# Patient Record
Sex: Female | Born: 1990 | Race: White | Hispanic: No | Marital: Married | Smoking: Never smoker
Health system: Southern US, Community
[De-identification: ages and names within clinical notes are randomized; demographics above are authoritative.]

## PROBLEM LIST (undated history)

## (undated) DIAGNOSIS — G43909 Migraine, unspecified, not intractable, without status migrainosus: Secondary | ICD-10-CM

## (undated) DIAGNOSIS — K760 Fatty (change of) liver, not elsewhere classified: Secondary | ICD-10-CM

## (undated) DIAGNOSIS — E559 Vitamin D deficiency, unspecified: Secondary | ICD-10-CM

## (undated) DIAGNOSIS — K219 Gastro-esophageal reflux disease without esophagitis: Secondary | ICD-10-CM

## (undated) DIAGNOSIS — M545 Low back pain, unspecified: Secondary | ICD-10-CM

## (undated) DIAGNOSIS — M2550 Pain in unspecified joint: Secondary | ICD-10-CM

## (undated) HISTORY — DX: Migraine, unspecified, not intractable, without status migrainosus: G43.909

## (undated) HISTORY — DX: Low back pain, unspecified: M54.50

## (undated) HISTORY — DX: Pain in unspecified joint: M25.50

## (undated) HISTORY — DX: Low back pain: M54.5

## (undated) HISTORY — DX: Vitamin D deficiency, unspecified: E55.9

---
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
## 2008-02-17 ENCOUNTER — Other Ambulatory Visit: Admission: RE | Admit: 2008-02-17 | Discharge: 2008-02-17 | Payer: Self-pay | Admitting: Family Medicine

## 2008-03-08 ENCOUNTER — Encounter: Admission: RE | Admit: 2008-03-08 | Discharge: 2008-03-08 | Payer: Self-pay | Admitting: Family Medicine

## 2009-10-21 ENCOUNTER — Emergency Department (HOSPITAL_COMMUNITY): Admission: EM | Admit: 2009-10-21 | Discharge: 2009-10-21 | Payer: Self-pay | Admitting: Emergency Medicine

## 2010-07-09 ENCOUNTER — Observation Stay (HOSPITAL_COMMUNITY): Admission: EM | Admit: 2010-07-09 | Discharge: 2010-07-11 | Payer: Self-pay | Admitting: Emergency Medicine

## 2011-01-09 LAB — COMPREHENSIVE METABOLIC PANEL
ALT: 81 U/L — ABNORMAL HIGH (ref 0–35)
ALT: 90 U/L — ABNORMAL HIGH (ref 0–35)
AST: 44 U/L — ABNORMAL HIGH (ref 0–37)
AST: 55 U/L — ABNORMAL HIGH (ref 0–37)
Albumin: 3.7 g/dL (ref 3.5–5.2)
Albumin: 3.9 g/dL (ref 3.5–5.2)
Alkaline Phosphatase: 69 U/L (ref 39–117)
Alkaline Phosphatase: 73 U/L (ref 39–117)
BUN: 11 mg/dL (ref 6–23)
BUN: 6 mg/dL (ref 6–23)
CO2: 26 mEq/L (ref 19–32)
CO2: 25 mEq/L (ref 19–32)
Calcium: 9.4 mg/dL (ref 8.4–10.5)
Calcium: 9.2 mg/dL (ref 8.4–10.5)
Chloride: 108 mEq/L (ref 96–112)
Chloride: 107 mEq/L (ref 96–112)
Creatinine, Ser: 0.94 mg/dL (ref 0.4–1.2)
Creatinine, Ser: 0.87 mg/dL (ref 0.4–1.2)
GFR calc Af Amer: >60 mL/min (ref >60)
GFR calc Af Amer: >60 mL/min (ref >60)
GFR calc non Af Amer: >60 mL/min (ref >60)
GFR calc non Af Amer: >60 mL/min (ref >60)
Glucose, Bld: 96 mg/dL (ref 70–99)
Glucose, Bld: 88 mg/dL (ref 70–99)
Potassium: 3.8 mEq/L (ref 3.5–5.1)
Potassium: 3.8 mEq/L (ref 3.5–5.1)
Sodium: 141 mEq/L (ref 135–145)
Sodium: 139 mEq/L (ref 135–145)
Total Bilirubin: 0.7 mg/dL (ref 0.3–1.2)
Total Bilirubin: 0.9 mg/dL (ref 0.3–1.2)
Total Protein: 6.7 g/dL (ref 6.0–8.3)
Total Protein: 7 g/dL (ref 6.0–8.3)

## 2011-01-09 LAB — DIFFERENTIAL
Basophils Absolute: 0 10*3/uL (ref 0.0–0.1)
Basophils Relative: 0 % (ref 0–1)
Eosinophils Absolute: 0.1 10*3/uL (ref 0.0–0.7)
Eosinophils Relative: 2 % (ref 0–5)
Lymphocytes Relative: 29 % (ref 12–46)
Lymphs Abs: 1.8 10*3/uL (ref 0.7–4.0)
Monocytes Absolute: 0.4 10*3/uL (ref 0.1–1.0)
Monocytes Relative: 6 % (ref 3–12)
Neutro Abs: 3.9 10*3/uL (ref 1.7–7.7)
Neutrophils Relative %: 63 % (ref 43–77)

## 2011-01-09 LAB — LIPID PANEL
Cholesterol: 153 mg/dL (ref 0–200)
LDL Cholesterol: 85 mg/dL (ref 0–99)
Triglycerides: 110 mg/dL (ref <150)

## 2011-01-09 LAB — CBC
HCT: 40.5 % (ref 36.0–46.0)
HCT: 43.7 % (ref 36.0–46.0)
Hemoglobin: 14.1 g/dL (ref 12.0–15.0)
Hemoglobin: 15.5 g/dL — ABNORMAL HIGH (ref 12.0–15.0)
MCH: 32 pg (ref 26.0–34.0)
MCH: 32.8 pg (ref 26.0–34.0)
MCHC: 34.8 g/dL (ref 30.0–36.0)
MCHC: 35.5 g/dL (ref 30.0–36.0)
MCV: 92 fL (ref 78.0–100.0)
MCV: 92.4 fL (ref 78.0–100.0)
Platelets: 230 10*3/uL (ref 150–400)
Platelets: 236 10*3/uL (ref 150–400)
RBC: 4.4 MIL/uL (ref 3.87–5.11)
RBC: 4.73 MIL/uL (ref 3.87–5.11)
RDW: 12.5 % (ref 11.5–15.5)
RDW: 12.6 % (ref 11.5–15.5)
WBC: 6.2 10*3/uL (ref 4.0–10.5)
WBC: 6.4 10*3/uL (ref 4.0–10.5)

## 2011-01-09 LAB — TSH: TSH: 1.871 u[IU]/mL (ref 0.350–4.500)

## 2011-01-09 LAB — TROPONIN I: Troponin I: 0.01 ng/mL (ref 0.00–0.06)

## 2011-01-09 LAB — BASIC METABOLIC PANEL
BUN: 6 mg/dL (ref 6–23)
CO2: 24 mEq/L (ref 19–32)
Calcium: 9.9 mg/dL (ref 8.4–10.5)
Chloride: 108 mEq/L (ref 96–112)
Creatinine, Ser: 0.88 mg/dL (ref 0.4–1.2)
GFR calc Af Amer: >60 mL/min (ref >60)
GFR calc non Af Amer: >60 mL/min (ref >60)
Glucose, Bld: 90 mg/dL (ref 70–99)
Potassium: 4 mEq/L (ref 3.5–5.1)
Sodium: 141 mEq/L (ref 135–145)

## 2011-01-09 LAB — CK TOTAL AND CKMB (NOT AT ARMC)
CK, MB: 1.3 ng/mL (ref 0.3–4.0)
Relative Index: 0.6 (ref 0.0–2.5)
Total CK: 236 U/L — ABNORMAL HIGH (ref 7–177)

## 2011-01-09 LAB — MRSA PCR SCREENING: MRSA by PCR: NEGATIVE

## 2011-01-09 LAB — CARDIAC PANEL(CRET KIN+CKTOT+MB+TROPI)
CK, MB: 0.9 ng/mL (ref 0.3–4.0)
CK, MB: 1 ng/mL (ref 0.3–4.0)
Relative Index: 0.5 (ref 0.0–2.5)
Relative Index: 0.6 (ref 0.0–2.5)
Total CK: 208 U/L — ABNORMAL HIGH (ref 7–177)
Troponin I: 0.01 ng/mL (ref 0.00–0.06)

## 2011-01-09 LAB — MAGNESIUM: Magnesium: 2.1 mg/dL (ref 1.5–2.5)

## 2011-01-09 LAB — POCT PREGNANCY, URINE: Preg Test, Ur: NEGATIVE

## 2011-01-09 LAB — POCT CARDIAC MARKERS
CKMB, poc: 1.1 ng/mL (ref 1.0–8.0)
Myoglobin, poc: 68.3 ng/mL (ref 12–200)
Troponin i, poc: <0.05 ng/mL (ref 0.00–0.09)

## 2011-01-09 LAB — BRAIN NATRIURETIC PEPTIDE: Pro B Natriuretic peptide (BNP): <30 pg/mL (ref 0.0–100.0)

## 2011-01-09 LAB — HEMOGLOBIN A1C
Hgb A1c MFr Bld: 5.4 % (ref <5.7)
Mean Plasma Glucose: 108 mg/dL (ref <117)

## 2011-01-09 LAB — PHOSPHORUS: Phosphorus: 3.6 mg/dL (ref 2.3–4.6)

## 2011-01-09 LAB — D-DIMER, QUANTITATIVE: D-Dimer, Quant: 0.34 ug/mL-FEU (ref 0.00–0.48)

## 2011-01-27 LAB — POCT RAPID STREP A (OFFICE): Streptococcus, Group A Screen (Direct): NEGATIVE

## 2011-06-30 ENCOUNTER — Inpatient Hospital Stay (INDEPENDENT_AMBULATORY_CARE_PROVIDER_SITE_OTHER)
Admission: RE | Admit: 2011-06-30 | Discharge: 2011-06-30 | Disposition: A | Discharge status: Home / Self Care | Payer: 59 | Source: Ambulatory Visit | Attending: Family Medicine | Admitting: Family Medicine

## 2011-06-30 DIAGNOSIS — J069 Acute upper respiratory infection, unspecified: Secondary | ICD-10-CM

## 2013-05-24 ENCOUNTER — Other Ambulatory Visit: Payer: Self-pay | Admitting: Family Medicine

## 2013-05-31 ENCOUNTER — Ambulatory Visit
Admission: RE | Admit: 2013-05-31 | Discharge: 2013-05-31 | Disposition: A | Discharge status: Home / Self Care | Payer: Managed Care, Other (non HMO) | Source: Ambulatory Visit | Attending: Family Medicine | Admitting: Family Medicine

## 2013-11-30 ENCOUNTER — Other Ambulatory Visit (INDEPENDENT_AMBULATORY_CARE_PROVIDER_SITE_OTHER): Payer: Self-pay

## 2013-11-30 ENCOUNTER — Encounter (INDEPENDENT_AMBULATORY_CARE_PROVIDER_SITE_OTHER): Payer: Self-pay | Admitting: General Surgery

## 2013-11-30 ENCOUNTER — Ambulatory Visit (INDEPENDENT_AMBULATORY_CARE_PROVIDER_SITE_OTHER): Payer: Managed Care, Other (non HMO) | Admitting: General Surgery

## 2013-11-30 ENCOUNTER — Encounter (INDEPENDENT_AMBULATORY_CARE_PROVIDER_SITE_OTHER): Payer: Self-pay

## 2013-11-30 NOTE — Progress Notes (Signed)
Subjective:   morbid obesity  Patient ID: Maria Mccoy, female   DOB: 1990-11-13, 23 y.o.   MRN: [id number]  HPI Maria Mccoy is a 23 y.o.female referred by Dr. Zachery DauerBarnes for consideration for surgical treatment for morbid obesity.  She  gives a history of progressive obesity since adolescence despite multiple attempts at medical management. She was overweight as a child an adolescent but was athletic. She however has seen her weight steadily increase over the years despite multiple attempts at nonsurgical management. Her mother was a patient of mine for a gastric bypass about 10 years ago. She initially felt that her mother should have been able to lose weight on her own but as she has gotten older and see how difficult it is to manage her weight she feels that she is going to need help to have any success. 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 She is also very concerned about her health going forward at her current weight..   She has been to our initial information seminar, researched surgical options thoroughly and is open to possibilities were discussed today.  History reviewed. No pertinent past medical history. History reviewed. No pertinent past surgical history. Current Outpatient Prescriptions  Medication Sig Dispense Refill  . rizatriptan (MAXALT) 10 MG tablet Take 10 mg by mouth as needed for migraine. May repeat in 2 hours if needed       No current facility-administered medications for this visit.   Allergies not on file History  Substance Use Topics  . Smoking status: Never Smoker   . Smokeless tobacco: Not on file  . Alcohol Use: No       Review of Systems  Constitutional: Positive for fatigue.  HENT: Negative.   Respiratory: Positive for shortness of breath (Mild, exertional only). Negative for apnea, choking, wheezing and stridor.   Cardiovascular:  Negative.   Gastrointestinal: Positive for abdominal pain (occasional epigastric pain about once a month with negative gallbladder ultrasound in the past). Negative for nausea, vomiting, diarrhea and constipation.       Significant reflux with nighttime heartburn and occasional regurgitation waterbrash for which he takes over-the-counter medications. Has been prescribed medications in the past but she has not taken  Genitourinary: Positive for menstrual problem.  Musculoskeletal: Positive for arthralgias.       Objective:   Physical Exam BP 122/78  Pulse 72  Resp 16  Ht 5\' 8"  (1.727 m)  Wt 324 lb 12.8 oz (147.328 kg)  BMI 49.40 kg/m2 General: Alert, morbidly obese Caucasian female, in no distress Skin: Warm and dry without rash or infection. HEENT: No palpable masses or thyromegaly. Sclera nonicteric. Pupils equal round and reactive. Oropharynx clear. Lymph nodes: No cervical, supraclavicular, or inguinal nodes palpable. Lungs: Breath sounds clear and equal without increased work of breathing Cardiovascular: Regular rate and rhythm without murmur. No JVD or edema. Peripheral pulses intact. Abdomen: Nondistended. Soft and nontender. No masses palpable. No organomegaly. No palpable hernias. Extremities: No edema or joint swelling or deformity. No chronic venous stasis changes. Neurologic: Alert and fully oriented. Gait normal.    Assessment:     23 year old with progressive morbid obesity unresponsive at multiple attempts at nonsurgical management who presents with a BMI of 49. She has comorbidities of significant symptomatic reflux as well as chronic joint pain. We discussed all surgical options available in detail today. She is not interested and  frequent office visits associated with lap band and at her high BMI I think she likely had inadequate weight loss. She was initially interested and sleeve gastrectomy we discussed that there is a chance that this could worsen her reflux and she  feels that this is a significant problem for her currently and would not want to take any chance it would be worsened. We discussed that gastric bypass is actually an effective antireflux operation. After discussion she has elected to proceed with gastric bypass which I believe would be the best choice for her due to her high BMI of significant reflux. We discussed the nature of the procedure and expected recovery period with discussed risks of anesthetic complications, staple line bleeding, leakage, pulmonary emboli as well as long-term risks of nutritional deficiencies, internal hernia, gallstones, ulcer, stricture. She was given a complete consent form to review all her questions were answered. We'll go ahead with preoperative workup including psychologic nutrition evaluation, BreathTek H. Pylori, bone density and routine lab work and x-rays including gallbladder ultrasound.     Plan:     Begin workup for planned laparoscopic Roux-en-Y gastric bypass as above

## 2013-12-30 ENCOUNTER — Ambulatory Visit (HOSPITAL_COMMUNITY)
Admission: RE | Admit: 2013-12-30 | Discharge: 2013-12-30 | Disposition: A | Discharge status: Home / Self Care | Payer: Managed Care, Other (non HMO) | Source: Ambulatory Visit | Attending: General Surgery | Admitting: General Surgery

## 2013-12-30 DIAGNOSIS — K7689 Other specified diseases of liver: Secondary | ICD-10-CM | POA: Insufficient documentation

## 2013-12-30 DIAGNOSIS — Z13820 Encounter for screening for osteoporosis: Secondary | ICD-10-CM | POA: Insufficient documentation

## 2013-12-30 DIAGNOSIS — Z6841 Body Mass Index (BMI) 40.0 and over, adult: Secondary | ICD-10-CM | POA: Insufficient documentation

## 2013-12-30 DIAGNOSIS — G8929 Other chronic pain: Secondary | ICD-10-CM | POA: Insufficient documentation

## 2013-12-30 DIAGNOSIS — M2550 Pain in unspecified joint: Secondary | ICD-10-CM | POA: Insufficient documentation

## 2013-12-30 DIAGNOSIS — R0602 Shortness of breath: Secondary | ICD-10-CM | POA: Insufficient documentation

## 2013-12-30 DIAGNOSIS — K219 Gastro-esophageal reflux disease without esophagitis: Secondary | ICD-10-CM | POA: Insufficient documentation

## 2013-12-31 ENCOUNTER — Ambulatory Visit: Payer: Managed Care, Other (non HMO) | Admitting: Dietician

## 2014-01-04 ENCOUNTER — Ambulatory Visit: Payer: Managed Care, Other (non HMO) | Admitting: Dietician

## 2014-01-05 ENCOUNTER — Encounter: Payer: Self-pay | Admitting: Dietician

## 2014-01-05 ENCOUNTER — Ambulatory Visit (HOSPITAL_COMMUNITY)
Admission: RE | Admit: 2014-01-05 | Discharge: 2014-01-05 | Disposition: A | Discharge status: Home / Self Care | Payer: Managed Care, Other (non HMO) | Source: Ambulatory Visit | Attending: General Surgery | Admitting: General Surgery

## 2014-01-05 ENCOUNTER — Encounter (HOSPITAL_COMMUNITY)
Admission: RE | Disposition: A | Discharge status: Home / Self Care | Payer: Self-pay | Source: Ambulatory Visit | Attending: General Surgery

## 2014-01-05 ENCOUNTER — Encounter: Payer: Managed Care, Other (non HMO) | Attending: General Surgery | Admitting: Dietician

## 2014-01-05 DIAGNOSIS — Z01818 Encounter for other preprocedural examination: Secondary | ICD-10-CM | POA: Insufficient documentation

## 2014-01-05 DIAGNOSIS — Z713 Dietary counseling and surveillance: Secondary | ICD-10-CM | POA: Insufficient documentation

## 2014-01-05 HISTORY — PX: BREATH TEK H PYLORI: SHX5422

## 2014-01-05 SURGERY — BREATH TEST, FOR HELICOBACTER PYLORI

## 2014-01-05 NOTE — Patient Instructions (Signed)
Patient to call the [facility] to enroll in Pre-Op and Post-Op Nutrition Education when surgery date is scheduled. 

## 2014-01-05 NOTE — Progress Notes (Signed)
  Pre-Op Assessment Visit:  Pre-Operative RYGB Surgery  Medical Nutrition Therapy:  Appt start time: 1030   End time:  1115  Patient was seen on 01/05/2014 for Pre-Operative RYGB Nutrition Assessment. Assessment and [facility] of approval [facility] coordinator on 01/05/2014.   Preferred Learning Style:   No preference indicated   Learning Readiness:   Contemplating   Handouts given during visit include:  Pre-Op Goals Bariatric Surgery Protein Shakes   Samples provided:  Premier protein shake (chocolate) Lot# [id number] Exp: 10/2014  Teaching Method Utilized:  Visual Auditory Hands on  Barriers to learning/adherence to lifestyle change: none  Demonstrated degree of understanding via:  Teach Back   Patient to call the [facility] to enroll in Pre-Op and Post-Op Nutrition Education when surgery date is scheduled.

## 2014-01-06 ENCOUNTER — Encounter (HOSPITAL_COMMUNITY): Payer: Self-pay | Admitting: General Surgery

## 2014-01-26 ENCOUNTER — Telehealth (INDEPENDENT_AMBULATORY_CARE_PROVIDER_SITE_OTHER): Payer: Self-pay

## 2014-01-26 NOTE — Telephone Encounter (Signed)
Message copied by Maryan PulsMOORE, Gennaro Lizotte on Thu Jan 26, 2014  3:45 PM ------      Message from: Leanne ChangGALLOWAY, WENDY K      Created: Fri Jan 20, 2014 11:30 AM      Regarding: Dr Johna SheriffHoxworth      Contact: [phone]       Would like ultra sound results from 12/30/13.  ------

## 2014-01-26 NOTE — Telephone Encounter (Signed)
Called and left message for patient regarding her US results (no acute findings)

## 2014-03-28 LAB — LIPID PANEL
CHOL/HDL RATIO: 3.6 ratio
Cholesterol: 142 mg/dL (ref 0–200)
HDL: 40 mg/dL (ref >39)
LDL CALC: 82 mg/dL (ref 0–99)
TRIGLYCERIDES: 102 mg/dL (ref <150)
VLDL: 20 mg/dL (ref 0–40)

## 2014-03-28 LAB — CBC WITH DIFFERENTIAL/PLATELET
BASOS ABS: 0 10*3/uL (ref 0.0–0.1)
Basophils Relative: 0 % (ref 0–1)
EOS ABS: 0.1 10*3/uL (ref 0.0–0.7)
EOS PCT: 3 % (ref 0–5)
HCT: 41 % (ref 36.0–46.0)
Hemoglobin: 14.2 g/dL (ref 12.0–15.0)
LYMPHS ABS: 2 10*3/uL (ref 0.7–4.0)
LYMPHS PCT: 43 % (ref 12–46)
MCH: 31.9 pg (ref 26.0–34.0)
MCHC: 34.6 g/dL (ref 30.0–36.0)
MCV: 92.1 fL (ref 78.0–100.0)
Monocytes Absolute: 0.4 10*3/uL (ref 0.1–1.0)
Monocytes Relative: 8 % (ref 3–12)
NEUTROS PCT: 46 % (ref 43–77)
Neutro Abs: 2.2 10*3/uL (ref 1.7–7.7)
PLATELETS: 249 10*3/uL (ref 150–400)
RBC: 4.45 MIL/uL (ref 3.87–5.11)
RDW: 13.3 % (ref 11.5–15.5)
WBC: 4.7 10*3/uL (ref 4.0–10.5)

## 2014-03-28 LAB — COMPREHENSIVE METABOLIC PANEL
ALT: 54 U/L — AB (ref 0–35)
AST: 39 U/L — AB (ref 0–37)
Albumin: 4.1 g/dL (ref 3.5–5.2)
Alkaline Phosphatase: 66 U/L (ref 39–117)
BILIRUBIN TOTAL: 0.8 mg/dL (ref 0.2–1.2)
BUN: 13 mg/dL (ref 6–23)
CHLORIDE: 106 meq/L (ref 96–112)
CO2: 25 meq/L (ref 19–32)
CREATININE: 0.83 mg/dL (ref 0.50–1.10)
Calcium: 9.4 mg/dL (ref 8.4–10.5)
Glucose, Bld: 91 mg/dL (ref 70–99)
Potassium: 4.1 mEq/L (ref 3.5–5.3)
SODIUM: 139 meq/L (ref 135–145)
TOTAL PROTEIN: 6.5 g/dL (ref 6.0–8.3)

## 2014-03-28 LAB — TSH: TSH: 1.039 u[IU]/mL (ref 0.350–4.500)

## 2014-03-28 LAB — HCG, SERUM, QUALITATIVE: Preg, Serum: NEGATIVE

## 2014-05-08 ENCOUNTER — Encounter: Payer: Managed Care, Other (non HMO) | Attending: General Surgery

## 2014-05-08 DIAGNOSIS — Z713 Dietary counseling and surveillance: Secondary | ICD-10-CM | POA: Diagnosis not present

## 2014-05-08 DIAGNOSIS — Z6841 Body Mass Index (BMI) 40.0 and over, adult: Secondary | ICD-10-CM | POA: Diagnosis not present

## 2014-05-10 NOTE — Progress Notes (Signed)
  Pre-Operative Nutrition Class:  Appt start time: 7615   End time:  1830.  Patient was seen on 05/08/2014 for Pre-Operative Bariatric Surgery Education at the [facility].   Surgery date: 06/05/2014 Surgery type: RYGB Start weight at [facility]: 327.5 lbs on 01/05/2014 Weight today: 319 lbs  TANITA  BODY COMP RESULTS  05/08/14   BMI (kg/m^2) 48.5   Fat Mass (lbs) 177.5   Fat Free Mass (lbs) 141.5   Total Body Water (lbs) 103.5   Samples given per MNT protocol. Patient educated on appropriate usage: Premier protein shake (strawberry - qty 1) Lot #: [id number] Exp: 02/2015  Bariatric Advantage Calcium Citrate (chocolate - qty 1) Lot #: [id number] Exp: 03/2015  Celebrate Vitamins Multivitamin (orange - qty 1) Lot #: [id number] Exp: 11/2014  Renee Pain Protein Powder (vanilla - qty 1) Lot #: [id number] Exp: 07/2015   The following the learning objectives were met by the patient during this course:  Identify Pre-Op Dietary Goals and will begin 2 weeks pre-operatively  Identify appropriate sources of fluids and proteins   State protein recommendations and appropriate sources pre and post-operatively  Identify Post-Operative Dietary Goals and will follow for 2 weeks post-operatively  Identify appropriate multivitamin and calcium sources  Describe the need for physical activity post-operatively and will follow MD recommendations  State when to call healthcare provider regarding medication questions or post-operative complications  Handouts given during class include:  Pre-Op Bariatric Surgery Diet Handout  Protein Shake Handout  Post-Op Bariatric Surgery Nutrition Handout  BELT Program Information Flyer  Support Group Information Flyer  [facility] Bariatric Supplements Price List  Follow-Up Plan: Patient will follow-up at [facility] 2 weeks post operatively for diet advancement per MD.

## 2014-05-24 NOTE — Progress Notes (Signed)
Need orders when possible please - PT COMING FOR PREOP THURS 06/01/14 - thank you

## 2014-05-29 ENCOUNTER — Encounter (HOSPITAL_COMMUNITY): Payer: Self-pay | Admitting: Pharmacy Technician

## 2014-05-31 ENCOUNTER — Other Ambulatory Visit (HOSPITAL_COMMUNITY): Payer: Self-pay | Admitting: General Surgery

## 2014-05-31 ENCOUNTER — Encounter (INDEPENDENT_AMBULATORY_CARE_PROVIDER_SITE_OTHER): Payer: Self-pay | Admitting: General Surgery

## 2014-05-31 ENCOUNTER — Other Ambulatory Visit (INDEPENDENT_AMBULATORY_CARE_PROVIDER_SITE_OTHER): Payer: Self-pay | Admitting: General Surgery

## 2014-05-31 ENCOUNTER — Ambulatory Visit (INDEPENDENT_AMBULATORY_CARE_PROVIDER_SITE_OTHER): Payer: Managed Care, Other (non HMO) | Admitting: General Surgery

## 2014-05-31 MED ORDER — ONDANSETRON HCL 4 MG PO TABS: 4.0000 mg | ORAL_TABLET | Freq: Three times a day (TID) | ORAL | Status: AC | PRN

## 2014-05-31 MED ORDER — OXYCODONE HCL 5 MG/5ML PO SOLN: 5.0000 mg | ORAL | Status: AC | PRN

## 2014-05-31 NOTE — Progress Notes (Signed)
Chief complaint: Preop gastric bypass  History: Patient returns for a preop visit prior to planned laparoscopic Roux-en-Y gastric bypass for morbid obesity. She has successfully completed her preoperative workup. There were no concerns Raised on psychologic or nutritional evaluation. We reviewed her ultrasound and lab work and x-rays which were unremarkable except for some mild elevated LFTs which are chronic. She has started her preoperative diet.  Past Medical History  Diagnosis Date  . Migraines   . Low back pain   . Pain in joint   . Vitamin D deficiency    Past Surgical History  Procedure Laterality Date  . Breath tek h pylori N/A 01/05/2014    Procedure: BREATH TEK H PYLORI;  Surgeon: Mariella SaaBenjamin T Kala Gassmann, MD;  [facility];  Service: General;  Laterality: N/A;   Current Outpatient Prescriptions  Medication Sig Dispense Refill  . acetaminophen (TYLENOL) 500 MG tablet Take 1,000 mg by mouth every 6 (six) hours as needed for mild pain or moderate pain.      . ranitidine (ZANTAC) 150 MG tablet Take 150 mg by mouth 2 (two) times daily.      . rizatriptan (MAXALT) 10 MG tablet Take 10 mg by mouth as needed for migraine. May repeat in 2 hours if needed       No current facility-administered medications for this visit.   No Known Allergies Exam: BP 124/74  Pulse 75  Temp(Src) 97.5 F (36.4 C)  Ht 5\' 8"  (1.727 m)  Wt 317 lb (143.79 kg)  BMI 48.21 kg/m2 Gen.: Morbidly obese but otherwise well-appearing Skin: No rash or infection HEENT: No palpable masses or scleral icterus Lungs: Clear equal breath sounds without increased work of breathing Cardiac: Regular rate and rhythm no edema Abdomen: Soft and nontender.  Assessment and plan: Agree to proceed with planned laparoscopic Roux-en-Y gastric bypass. She was given a prescription for bowel prep and pain medication. We reviewed the consent form and all her questions were answered.

## 2014-05-31 NOTE — Progress Notes (Signed)
Chest x ray 3/15 epic

## 2014-05-31 NOTE — Patient Instructions (Addendum)
Your procedure is scheduled on: 06/05/14  MONDAY   [facility]- FOLLOW SIGNS TO SHORT STAY CENTER Short Stay Center at    0515   AM.   Call this number if you have problems the morning of surgery: [phone]     BOWEL PREP AS PER OFFICE   Do not TAKE ANYTHING BY MOUTH After Midnight. Sunday NIGHT   Take these medicines the morning of surgery with A SIP OF WATER :Zantac MAY TAKE MAXALT  IF NEEDED    .  Contacts, dentures or partial plates, or metal hairpins  can not be worn to surgery. Your family will be responsible for glasses, dentures, hearing aides while you are in surgery  Leave suitcase in the car. After surgery it may be brought to your room.  For patients admitted to the hospital, checkout time is 11:00 AM day of  discharge.         [facility] IS NOT RESPONSIBLE FOR ANY VALUABLES                                                                                                                                          [facility] - Preparing for Surgery Before surgery, you can play an important role.  Because skin is not sterile, your skin needs to be as free of germs as possible.  You can reduce the number of germs on your skin by washing with CHG (chlorahexidine gluconate) soap before surgery.  CHG is an antiseptic cleaner which kills germs and bonds with the skin to continue killing germs even after washing. Please DO NOT use if you have an allergy to CHG or antibacterial soaps.  If your skin becomes reddened/irritated stop using the CHG and inform your nurse when you arrive at Short Stay. Do not shave (including legs and underarms) for at least 48 hours prior to the first CHG shower.  You may shave your face/neck. Please follow these instructions carefully:  1.  Shower with CHG Soap the night before surgery and the  morning of Surgery.  2.  If you choose to wash your hair, wash your hair first as usual with your  normal  shampoo.  3.   After you shampoo, rinse your hair and body thoroughly to remove the  shampoo.                           4.  Use CHG as you would any other liquid soap.  You can apply chg directly  to the skin and wash                       Gently with a scrungie or clean washcloth.  5.  Apply the CHG Soap to your body ONLY FROM THE NECK DOWN.  Do not use on face/ open                           Wound or open sores. Avoid contact with eyes, ears mouth and genitals (private parts).                       Wash face,  Genitals (private parts) with your normal soap.             6.  Wash thoroughly, paying special attention to the area where your surgery  will be performed.  7.  Thoroughly rinse your body with warm water from the neck down.  8.  DO NOT shower/wash with your normal soap after using and rinsing off  the CHG Soap.                9.  Pat yourself dry with a clean towel.            10.  Wear clean pajamas.            11.  Place clean sheets on your bed the night of your first shower and do not  sleep with pets. Day of Surgery : Do not apply any lotions/deodorants the morning of surgery.  Please wear clean clothes to the hospital/surgery center.  FAILURE TO FOLLOW THESE INSTRUCTIONS MAY RESULT IN THE CANCELLATION OF YOUR SURGERY PATIENT SIGNATURE_________________________________  NURSE SIGNATURE__________________________________  ________________________________________________________________________

## 2014-06-01 ENCOUNTER — Encounter (HOSPITAL_COMMUNITY): Payer: Self-pay

## 2014-06-01 ENCOUNTER — Encounter (HOSPITAL_COMMUNITY)
Admission: RE | Admit: 2014-06-01 | Discharge: 2014-06-01 | Disposition: A | Discharge status: Home / Self Care | Payer: Managed Care, Other (non HMO) | Source: Ambulatory Visit | Attending: General Surgery | Admitting: General Surgery

## 2014-06-01 DIAGNOSIS — Z01818 Encounter for other preprocedural examination: Secondary | ICD-10-CM | POA: Insufficient documentation

## 2014-06-01 DIAGNOSIS — Z01812 Encounter for preprocedural laboratory examination: Secondary | ICD-10-CM | POA: Insufficient documentation

## 2014-06-01 HISTORY — DX: Fatty (change of) liver, not elsewhere classified: K76.0

## 2014-06-01 HISTORY — DX: Gastro-esophageal reflux disease without esophagitis: K21.9

## 2014-06-01 LAB — CBC
HCT: 43.9 % (ref 36.0–46.0)
Hemoglobin: 15.1 g/dL — ABNORMAL HIGH (ref 12.0–15.0)
MCH: 32.4 pg (ref 26.0–34.0)
MCHC: 34.4 g/dL (ref 30.0–36.0)
MCV: 94.2 fL (ref 78.0–100.0)
PLATELETS: 218 10*3/uL (ref 150–400)
RBC: 4.66 MIL/uL (ref 3.87–5.11)
RDW: 12.8 % (ref 11.5–15.5)
WBC: 6.1 10*3/uL (ref 4.0–10.5)

## 2014-06-05 ENCOUNTER — Inpatient Hospital Stay (HOSPITAL_COMMUNITY)
Admission: RE | Admit: 2014-06-05 | Discharge: 2014-06-07 | DRG: 621 | Disposition: A | Discharge status: Home / Self Care | Payer: Managed Care, Other (non HMO) | Source: Ambulatory Visit | Attending: General Surgery | Admitting: General Surgery

## 2014-06-05 ENCOUNTER — Encounter (HOSPITAL_COMMUNITY): Payer: Self-pay | Admitting: *Deleted

## 2014-06-05 ENCOUNTER — Encounter (HOSPITAL_COMMUNITY)
Admission: RE | Disposition: A | Discharge status: Home / Self Care | Payer: Self-pay | Source: Ambulatory Visit | Attending: General Surgery

## 2014-06-05 ENCOUNTER — Encounter (HOSPITAL_COMMUNITY): Payer: Managed Care, Other (non HMO) | Admitting: Certified Registered Nurse Anesthetist

## 2014-06-05 ENCOUNTER — Inpatient Hospital Stay (HOSPITAL_COMMUNITY): Payer: Managed Care, Other (non HMO) | Admitting: Certified Registered Nurse Anesthetist

## 2014-06-05 DIAGNOSIS — E559 Vitamin D deficiency, unspecified: Secondary | ICD-10-CM | POA: Diagnosis present

## 2014-06-05 DIAGNOSIS — Z6841 Body Mass Index (BMI) 40.0 and over, adult: Secondary | ICD-10-CM

## 2014-06-05 DIAGNOSIS — R1012 Left upper quadrant pain: Secondary | ICD-10-CM | POA: Diagnosis not present

## 2014-06-05 DIAGNOSIS — Z01812 Encounter for preprocedural laboratory examination: Secondary | ICD-10-CM

## 2014-06-05 DIAGNOSIS — R7989 Other specified abnormal findings of blood chemistry: Secondary | ICD-10-CM | POA: Diagnosis present

## 2014-06-05 DIAGNOSIS — Z79899 Other long term (current) drug therapy: Secondary | ICD-10-CM

## 2014-06-05 HISTORY — PX: GASTRIC ROUX-EN-Y: SHX5262

## 2014-06-05 LAB — COMPREHENSIVE METABOLIC PANEL
ALT: 56 U/L — AB (ref 0–35)
AST: 34 U/L (ref 0–37)
Albumin: 4 g/dL (ref 3.5–5.2)
Alkaline Phosphatase: 75 U/L (ref 39–117)
Anion gap: 15 (ref 5–15)
BILIRUBIN TOTAL: 0.8 mg/dL (ref 0.3–1.2)
BUN: 7 mg/dL (ref 6–23)
CALCIUM: 9.6 mg/dL (ref 8.4–10.5)
CHLORIDE: 105 meq/L (ref 96–112)
CO2: 23 meq/L (ref 19–32)
Creatinine, Ser: 0.91 mg/dL (ref 0.50–1.10)
GFR calc Af Amer: >90 mL/min (ref >90)
GFR calc non Af Amer: 87 mL/min — ABNORMAL LOW (ref >90)
Glucose, Bld: 99 mg/dL (ref 70–99)
Potassium: 4 mEq/L (ref 3.7–5.3)
Sodium: 143 mEq/L (ref 137–147)
Total Protein: 7.1 g/dL (ref 6.0–8.3)

## 2014-06-05 LAB — HCG, SERUM, QUALITATIVE: PREG SERUM: NEGATIVE

## 2014-06-05 LAB — HEMOGLOBIN AND HEMATOCRIT, BLOOD
HCT: 42 % (ref 36.0–46.0)
HEMOGLOBIN: 14.4 g/dL (ref 12.0–15.0)

## 2014-06-05 SURGERY — LAPAROSCOPIC ROUX-EN-Y GASTRIC
Anesthesia: General | Site: Abdomen

## 2014-06-05 MED ORDER — HEPARIN SODIUM (PORCINE) 5000 UNIT/ML IJ SOLN
5000.0000 [IU] | Freq: Three times a day (TID) | INTRAMUSCULAR | Status: DC
  Administered 2014-06-05 – 2014-06-07 (×6): 5000 [IU] via SUBCUTANEOUS
  Filled 2014-06-05 (×8): qty 1

## 2014-06-05 MED ORDER — CHLORHEXIDINE GLUCONATE 4 % EX LIQD: 60.0000 mL | Freq: Once | CUTANEOUS | Status: DC

## 2014-06-05 MED ORDER — HEPARIN SODIUM (PORCINE) 5000 UNIT/ML IJ SOLN
5000.0000 [IU] | INTRAMUSCULAR | Status: AC
  Administered 2014-06-05: 5000 [IU] via SUBCUTANEOUS
  Filled 2014-06-05: qty 1

## 2014-06-05 MED ORDER — ONDANSETRON HCL 4 MG/2ML IJ SOLN
4.0000 mg | INTRAMUSCULAR | Status: DC | PRN
  Administered 2014-06-05 – 2014-06-07 (×6): 4 mg via INTRAVENOUS
  Filled 2014-06-05 (×6): qty 2

## 2014-06-05 MED ORDER — LACTATED RINGERS IR SOLN
Status: DC | PRN
  Administered 2014-06-05: 1000 mL

## 2014-06-05 MED ORDER — DEXTROSE 5 % IV SOLN
INTRAVENOUS | Status: AC
  Filled 2014-06-05: qty 2

## 2014-06-05 MED ORDER — POTASSIUM CHLORIDE IN NACL 20-0.9 MEQ/L-% IV SOLN
INTRAVENOUS | Status: DC
  Administered 2014-06-05 – 2014-06-07 (×4): via INTRAVENOUS
  Filled 2014-06-05 (×8): qty 1000

## 2014-06-05 MED ORDER — DEXTROSE 5 % IV SOLN
2.0000 g | INTRAVENOUS | Status: AC
  Administered 2014-06-05: 2 g via INTRAVENOUS

## 2014-06-05 MED ORDER — SCOPOLAMINE 1 MG/3DAYS TD PT72
MEDICATED_PATCH | TRANSDERMAL | Status: DC | PRN
  Administered 2014-06-05: 1 via TRANSDERMAL

## 2014-06-05 MED ORDER — MIDAZOLAM HCL 2 MG/2ML IJ SOLN
INTRAMUSCULAR | Status: AC
  Filled 2014-06-05: qty 2

## 2014-06-05 MED ORDER — LACTATED RINGERS IV SOLN: INTRAVENOUS | Status: DC

## 2014-06-05 MED ORDER — TISSEEL VH 10 ML EX KIT
PACK | CUTANEOUS | Status: DC | PRN
  Administered 2014-06-05: 10 mL

## 2014-06-05 MED ORDER — LIDOCAINE HCL (CARDIAC) 20 MG/ML IV SOLN
INTRAVENOUS | Status: DC | PRN
  Administered 2014-06-05: 100 mg via INTRAVENOUS
  Administered 2014-06-05: 50 mg via INTRATRACHEAL

## 2014-06-05 MED ORDER — METOCLOPRAMIDE HCL 5 MG/ML IJ SOLN
INTRAMUSCULAR | Status: DC | PRN
  Administered 2014-06-05: 10 mg via INTRAVENOUS

## 2014-06-05 MED ORDER — SUCCINYLCHOLINE CHLORIDE 20 MG/ML IJ SOLN
INTRAMUSCULAR | Status: DC | PRN
  Administered 2014-06-05: 120 mg via INTRAVENOUS

## 2014-06-05 MED ORDER — MIDAZOLAM HCL 5 MG/5ML IJ SOLN
INTRAMUSCULAR | Status: DC | PRN
  Administered 2014-06-05 (×2): 2 mg via INTRAVENOUS

## 2014-06-05 MED ORDER — MEPERIDINE HCL 50 MG/ML IJ SOLN: 6.2500 mg | INTRAMUSCULAR | Status: DC | PRN

## 2014-06-05 MED ORDER — ACETAMINOPHEN 10 MG/ML IV SOLN
1000.0000 mg | Freq: Four times a day (QID) | INTRAVENOUS | Status: DC
  Filled 2014-06-05 (×3): qty 100

## 2014-06-05 MED ORDER — BUPIVACAINE-EPINEPHRINE 0.25% -1:200000 IJ SOLN
INTRAMUSCULAR | Status: DC | PRN
  Administered 2014-06-05: 20 mL
  Administered 2014-06-05: 10 mL

## 2014-06-05 MED ORDER — SUFENTANIL CITRATE 50 MCG/ML IV SOLN
INTRAVENOUS | Status: AC
  Filled 2014-06-05: qty 1

## 2014-06-05 MED ORDER — ACETAMINOPHEN 10 MG/ML IV SOLN
1000.0000 mg | Freq: Once | INTRAVENOUS | Status: DC
  Filled 2014-06-05: qty 100

## 2014-06-05 MED ORDER — ACETAMINOPHEN 160 MG/5ML PO SOLN: 650.0000 mg | ORAL | Status: DC | PRN

## 2014-06-05 MED ORDER — ACETAMINOPHEN 160 MG/5ML PO SOLN: 325.0000 mg | ORAL | Status: DC | PRN

## 2014-06-05 MED ORDER — FENTANYL CITRATE 0.05 MG/ML IJ SOLN: 25.0000 ug | INTRAMUSCULAR | Status: DC | PRN

## 2014-06-05 MED ORDER — ONDANSETRON HCL 4 MG/2ML IJ SOLN
INTRAMUSCULAR | Status: AC
  Filled 2014-06-05: qty 2

## 2014-06-05 MED ORDER — PROMETHAZINE HCL 25 MG/ML IJ SOLN: 6.2500 mg | INTRAMUSCULAR | Status: DC | PRN

## 2014-06-05 MED ORDER — OXYCODONE HCL 5 MG/5ML PO SOLN
5.0000 mg | ORAL | Status: DC | PRN
  Administered 2014-06-06 – 2014-06-07 (×3): 10 mg via ORAL
  Filled 2014-06-05 (×3): qty 10

## 2014-06-05 MED ORDER — GLYCOPYRROLATE 0.2 MG/ML IJ SOLN
INTRAMUSCULAR | Status: DC | PRN
  Administered 2014-06-05: .8 mg via INTRAVENOUS

## 2014-06-05 MED ORDER — DEXAMETHASONE SODIUM PHOSPHATE 10 MG/ML IJ SOLN
INTRAMUSCULAR | Status: DC | PRN
  Administered 2014-06-05: 10 mg via INTRAVENOUS

## 2014-06-05 MED ORDER — UNJURY CHOCOLATE CLASSIC POWDER: 2.0000 [oz_av] | Freq: Four times a day (QID) | ORAL | Status: DC

## 2014-06-05 MED ORDER — MORPHINE SULFATE 2 MG/ML IJ SOLN
2.0000 mg | INTRAMUSCULAR | Status: DC | PRN
  Administered 2014-06-05 (×2): 2 mg via INTRAVENOUS
  Administered 2014-06-05: 6 mg via INTRAVENOUS
  Administered 2014-06-05: 2 mg via INTRAVENOUS
  Administered 2014-06-06: 4 mg via INTRAVENOUS
  Filled 2014-06-05 (×2): qty 1
  Filled 2014-06-05: qty 2
  Filled 2014-06-05: qty 3
  Filled 2014-06-05: qty 1

## 2014-06-05 MED ORDER — SODIUM CHLORIDE 0.9 % IJ SOLN
INTRAMUSCULAR | Status: AC
  Filled 2014-06-05: qty 10

## 2014-06-05 MED ORDER — TISSEEL VH 10 ML EX KIT
PACK | CUTANEOUS | Status: AC
  Filled 2014-06-05: qty 2

## 2014-06-05 MED ORDER — LABETALOL HCL 5 MG/ML IV SOLN
INTRAVENOUS | Status: DC | PRN
  Administered 2014-06-05: 5 mg via INTRAVENOUS

## 2014-06-05 MED ORDER — ACETAMINOPHEN 10 MG/ML IV SOLN
1000.0000 mg | Freq: Four times a day (QID) | INTRAVENOUS | Status: AC | PRN
  Administered 2014-06-05 – 2014-06-06 (×3): 1000 mg via INTRAVENOUS
  Filled 2014-06-05 (×3): qty 100

## 2014-06-05 MED ORDER — UNJURY VANILLA POWDER
2.0000 [oz_av] | Freq: Four times a day (QID) | ORAL | Status: DC
  Administered 2014-06-07: 2 [oz_av] via ORAL

## 2014-06-05 MED ORDER — LACTATED RINGERS IV SOLN
INTRAVENOUS | Status: DC | PRN
  Administered 2014-06-05 (×3): via INTRAVENOUS

## 2014-06-05 MED ORDER — SUFENTANIL CITRATE 50 MCG/ML IV SOLN
INTRAVENOUS | Status: DC | PRN
  Administered 2014-06-05 (×15): 10 ug via INTRAVENOUS

## 2014-06-05 MED ORDER — SCOPOLAMINE 1 MG/3DAYS TD PT72
MEDICATED_PATCH | TRANSDERMAL | Status: AC
  Filled 2014-06-05: qty 1

## 2014-06-05 MED ORDER — NEOSTIGMINE METHYLSULFATE 10 MG/10ML IV SOLN
INTRAVENOUS | Status: DC | PRN
  Administered 2014-06-05: 5 mg via INTRAVENOUS

## 2014-06-05 MED ORDER — ONDANSETRON HCL 4 MG/2ML IJ SOLN
INTRAMUSCULAR | Status: DC | PRN
Start: 2014-06-05 — End: 2014-06-05
  Administered 2014-06-05: 4 mg via INTRAVENOUS

## 2014-06-05 MED ORDER — BUPIVACAINE-EPINEPHRINE (PF) 0.25% -1:200000 IJ SOLN
INTRAMUSCULAR | Status: AC
  Filled 2014-06-05: qty 30

## 2014-06-05 MED ORDER — PROPOFOL 10 MG/ML IV BOLUS
INTRAVENOUS | Status: AC
  Filled 2014-06-05: qty 20

## 2014-06-05 MED ORDER — ROCURONIUM BROMIDE 100 MG/10ML IV SOLN
INTRAVENOUS | Status: AC
  Filled 2014-06-05: qty 1

## 2014-06-05 MED ORDER — PROPOFOL 10 MG/ML IV BOLUS
INTRAVENOUS | Status: DC | PRN
  Administered 2014-06-05 (×3): 20 mg via INTRAVENOUS
  Administered 2014-06-05: 200 mg via INTRAVENOUS
  Administered 2014-06-05 (×2): 20 mg via INTRAVENOUS

## 2014-06-05 MED ORDER — DEXAMETHASONE SODIUM PHOSPHATE 10 MG/ML IJ SOLN
INTRAMUSCULAR | Status: AC
  Filled 2014-06-05: qty 1

## 2014-06-05 MED ORDER — ROCURONIUM BROMIDE 100 MG/10ML IV SOLN
INTRAVENOUS | Status: DC | PRN
  Administered 2014-06-05: 10 mg via INTRAVENOUS
  Administered 2014-06-05: 30 mg via INTRAVENOUS
  Administered 2014-06-05: 20 mg via INTRAVENOUS
  Administered 2014-06-05: 5 mg via INTRAVENOUS
  Administered 2014-06-05: 45 mg via INTRAVENOUS

## 2014-06-05 MED ORDER — UNJURY CHICKEN SOUP POWDER
2.0000 [oz_av] | Freq: Four times a day (QID) | ORAL | Status: DC
  Administered 2014-06-07: 2 [oz_av] via ORAL

## 2014-06-05 MED ORDER — LIDOCAINE HCL (CARDIAC) 20 MG/ML IV SOLN
INTRAVENOUS | Status: AC
  Filled 2014-06-05: qty 5

## 2014-06-05 SURGICAL SUPPLY — 82 items
ADH SKN CLS APL DERMABOND .7 (GAUZE/BANDAGES/DRESSINGS) ×1
APL SRG 32X5 SNPLK LF DISP (MISCELLANEOUS) ×1
APPLICATOR COTTON TIP 6IN STRL (MISCELLANEOUS) ×6 IMPLANT
APPLIER CLIP ROT 13.4 12 LRG (CLIP)
APR CLP LRG 13.4X12 ROT 20 MLT (CLIP)
BAG SPEC RTRVL LRG 6X4 10 (ENDOMECHANICALS)
BLADE SURG SZ11 CARB STEEL (BLADE) ×3 IMPLANT
CABLE HIGH FREQUENCY MONO STRZ (ELECTRODE) ×3 IMPLANT
CANISTER SUCTION 2500CC (MISCELLANEOUS) ×3 IMPLANT
CHLORAPREP W/TINT 26ML (MISCELLANEOUS) ×5 IMPLANT
CLIP APPLIE ROT 13.4 12 LRG (CLIP) IMPLANT
CLIP SUT LAPRA TY ABSORB (SUTURE) ×6 IMPLANT
DECANTER SPIKE VIAL GLASS SM (MISCELLANEOUS) ×3 IMPLANT
DERMABOND ADVANCED (GAUZE/BANDAGES/DRESSINGS) ×2
DERMABOND ADVANCED .7 DNX12 (GAUZE/BANDAGES/DRESSINGS) ×1 IMPLANT
DEVICE SUTURE ENDOST 10MM (ENDOMECHANICALS) IMPLANT
DISSECTOR BLUNT TIP ENDO 5MM (MISCELLANEOUS) IMPLANT
DRAIN PENROSE 18X1/4 LTX STRL (WOUND CARE) ×3 IMPLANT
DRAPE CAMERA CLOSED 9X96 (DRAPES) ×3 IMPLANT
ELECT REM PT RETURN 9FT ADLT (ELECTROSURGICAL) ×3
ELECTRODE REM PT RTRN 9FT ADLT (ELECTROSURGICAL) ×1 IMPLANT
FLOSHIELD STORZ 10MM/45 (MISCELLANEOUS) ×2 IMPLANT
GAUZE SPONGE 4X4 12PLY STRL (GAUZE/BANDAGES/DRESSINGS) ×3 IMPLANT
GAUZE SPONGE 4X4 16PLY XRAY LF (GAUZE/BANDAGES/DRESSINGS) ×3 IMPLANT
GLOVE BIOGEL PI IND STRL 7.5 (GLOVE) ×1 IMPLANT
GLOVE BIOGEL PI INDICATOR 7.5 (GLOVE) ×8
GLOVE SS BIOGEL STRL SZ 7.5 (GLOVE) ×1 IMPLANT
GLOVE SUPERSENSE BIOGEL SZ 7.5 (GLOVE) ×10
GOWN STRL REUS W/TWL XL LVL3 (GOWN DISPOSABLE) ×10 IMPLANT
HEMOSTAT SURGICEL 4X8 (HEMOSTASIS) IMPLANT
HOVERMATT SINGLE USE (MISCELLANEOUS) ×3 IMPLANT
KIT BASIN OR (CUSTOM PROCEDURE TRAY) ×3 IMPLANT
KIT GASTRIC LAVAGE 34FR ADT (SET/KITS/TRAYS/PACK) ×3 IMPLANT
NDL SPNL 22GX3.5 QUINCKE BK (NEEDLE) ×1 IMPLANT
NEEDLE SPNL 22GX3.5 QUINCKE BK (NEEDLE) ×3 IMPLANT
PACK CARDIOVASCULAR III (CUSTOM PROCEDURE TRAY) ×3 IMPLANT
PEN SKIN MARKING BROAD (MISCELLANEOUS) ×3 IMPLANT
POUCH SPECIMEN RETRIEVAL 10MM (ENDOMECHANICALS) IMPLANT
RELOAD 45 VASCULAR/THIN (ENDOMECHANICALS) ×3 IMPLANT
RELOAD ENDO STITCH 2.0 (ENDOMECHANICALS)
RELOAD STAPLE 45 2.5 WHT GRN (ENDOMECHANICALS) ×1 IMPLANT
RELOAD STAPLE 45 3.5 BLU ETS (ENDOMECHANICALS) ×1 IMPLANT
RELOAD STAPLE 60 2.6 WHT THN (STAPLE) ×1 IMPLANT
RELOAD STAPLE 60 3.6 BLU REG (STAPLE) ×2 IMPLANT
RELOAD STAPLE 60 3.8 GOLD REG (STAPLE) ×1 IMPLANT
RELOAD STAPLE TA45 3.5 REG BLU (ENDOMECHANICALS) ×6 IMPLANT
RELOAD STAPLER BLUE 60MM (STAPLE) ×4 IMPLANT
RELOAD STAPLER GOLD 60MM (STAPLE) ×1 IMPLANT
RELOAD STAPLER WHITE 60MM (STAPLE) ×1 IMPLANT
RELOAD SUT SNGL STCH ABSRB 2-0 (ENDOMECHANICALS) IMPLANT
RELOAD SUT SNGL STCH BLK 2-0 (ENDOMECHANICALS) IMPLANT
SCISSORS LAP 5X35 DISP (ENDOMECHANICALS) ×3 IMPLANT
SEALANT SURGICAL APPL DUAL CAN (MISCELLANEOUS) ×3 IMPLANT
SET IRRIG TUBING LAPAROSCOPIC (IRRIGATION / IRRIGATOR) ×3 IMPLANT
SHEARS CURVED HARMONIC AC 45CM (MISCELLANEOUS) ×3 IMPLANT
SLEEVE ADV FIXATION 12X100MM (TROCAR) ×6 IMPLANT
SOLUTION ANTI FOG 6CC (MISCELLANEOUS) ×3 IMPLANT
STAPLE ECHEON FLEX 60 POW ENDO (STAPLE) ×3 IMPLANT
STAPLER RELOAD BLUE 60MM (STAPLE) ×12
STAPLER RELOAD GOLD 60MM (STAPLE) ×3
STAPLER RELOAD WHITE 60MM (STAPLE) ×3
STAPLER VISISTAT 35W (STAPLE) ×3 IMPLANT
SUT DVC SILK 2.0X39 (SUTURE) ×10 IMPLANT
SUT DVC VICRYL PGA 2.0X39 (SUTURE) ×12 IMPLANT
SUT MNCRL AB 4-0 PS2 18 (SUTURE) ×5 IMPLANT
SUT RELOAD ENDO STITCH 2 48X1 (ENDOMECHANICALS)
SUT RELOAD ENDO STITCH 2.0 (ENDOMECHANICALS)
SUT VIC AB 2-0 SH 27 (SUTURE) ×6
SUT VIC AB 2-0 SH 27X BRD (SUTURE) IMPLANT
SUTURE RELOAD END STTCH 2 48X1 (ENDOMECHANICALS) IMPLANT
SUTURE RELOAD ENDO STITCH 2.0 (ENDOMECHANICALS) IMPLANT
SYRINGE 20CC LL (MISCELLANEOUS) ×6 IMPLANT
SYRINGE 60CC LL (MISCELLANEOUS) ×3 IMPLANT
TOWEL OR 17X26 10 PK STRL BLUE (TOWEL DISPOSABLE) ×3 IMPLANT
TOWEL OR NON WOVEN STRL DISP B (DISPOSABLE) ×3 IMPLANT
TRAY FOLEY CATH 14FRSI W/METER (CATHETERS) ×3 IMPLANT
TROCAR ADV FIXATION 12X100MM (TROCAR) ×3 IMPLANT
TROCAR ADV FIXATION 5X100MM (TROCAR) ×3 IMPLANT
TROCAR BLADELESS OPT 5 100 (ENDOMECHANICALS) ×7 IMPLANT
TROCAR XCEL 12X100 BLDLESS (ENDOMECHANICALS) ×3 IMPLANT
TUBING ENDO SMARTCAP PENTAX (MISCELLANEOUS) ×3 IMPLANT
TUBING FILTER THERMOFLATOR (ELECTROSURGICAL) ×3 IMPLANT

## 2014-06-05 NOTE — Transfer of Care (Signed)
Immediate Anesthesia Transfer of Care Note  Patient: Maria Mccoy  Procedure(s) Performed: Procedure(s): LAPAROSCOPIC ROUX-EN-Y GASTRIC (N/A)  Patient Location: PACU  Anesthesia Type:General  Level of Consciousness: awake, alert , oriented and patient cooperative  Airway & Oxygen Therapy: Patient Spontanous Breathing and Patient connected to face mask oxygen  Post-op Assessment: Report given to PACU RN, Post -op Vital signs reviewed and stable and Patient moving all extremities X 4  Post vital signs: stable  Complications: No apparent anesthesia complications

## 2014-06-05 NOTE — Op Note (Signed)
Preop diagnosis: Morbid obesity  Postop diagnosis: Morbid obesity  Body mass index is 48.1 kg/(m^2).  Surgical procedure: Laparoscopic Roux-en-Y gastric bypass  Surgeon: Sharlet Salina T.Gearald Stonebraker M.D.  Asst.: Gaynelle Adu M.D.  Anesthesia: General  Complications:  None  EBL: Minimal  Drains: None  Disposition: PACU in good condition  Description of procedure: Patient is brought to the operating room and general anesthesia induced. She had received preoperative broad-spectrum IV antibiotics and subcutaneous heparin. The abdomen was widely sterilely prepped and draped. Patient timeout was performed and correct patient and procedure confirmed. Access was obtained with a 12 mm Optiview trocar in the left upper quadrant and pneumoperitoneum established without difficulty. Under direct vision 12 mm trocars were placed laterally in the right upper quadrant, right upper quadrant midclavicular line, and to the left and above the umbilicus for the camera port. A 5 mm trocar was placed laterally in the left upper quadrant. The omentum was brought into the upper abdomen and the transverse mesocolon elevated and the ligament of Treitz clearly identified. A 40 cm biliopancreatic limb was then carefully measured from the ligament of Treitz. The small intestine was divided at this point with a single firing of the white load linear stapler. A Penrose drain was sutured to the end of the Roux-en-Y limb for later identification. A 100 cm Roux-en-Y limb was then carefully measured. At this point a side-to-side anastomosis was created between the Roux limb and the end of the biliopancreatic limb. This was accomplished with a single firing of the 45 mm white load linear stapler. The common enterotomy was closed with a running 2-0 Vicryl begun at either end of the enterotomy and tied centrally. The mesenteric defect was then closed with running 2-0 silk. The omentum was then divided with the harmonic scalpel up towards the  transverse colon to allow mobility of the Roux limb toward the gastric pouch. The patient was then placed in steep reversed Trendelenburg. Through a 5 mm subxiphoid site the [facility] retractor was placed and the left lobe of the liver elevated with excellent exposure of the upper stomach and hiatus. The angle of Hiss was then mobilized with the harmonic scalpel. A 4 cm gastric pouch was then carefully measured along the lesser curve of the stomach. Dissection was carried along the lesser curve at this point with the Harmonic scalpel working carefully back toward the lesser sac at right angles to the lesser curve. The free lesser sac was then entered. After being sure all tubes were removed from the stomach an initial firing of the gold load 60 mm linear stapler was fired at right angles across the lesser curve for about 4 cm. The gastric pouch was further mobilized posteriorly and then the pouch was completed with 2 further firings of the 60 mm blue load linear stapler up through the previously dissected angle of His. It was ensured that the pouch was completely mobilized away from the gastric remnant. This created a nice tubular 4-5 cm gastric pouch. The staple line of the gastric remnant was then oversewn with 2-0 silk for hemostasis. The Roux limb was then brought up in an antecolic fashion with the candycane facing to the patient's left without undue tension. The gastrojejunostomy was created with an initial posterior row of 2-0 Vicryl between the Roux limb and the staple line of the gastric pouch. Enterotomies were then made in the gastric pouch and the Roux limb with the harmonic scalpel and at approximately 2-2-1/2 cm anastomosis was created with a single  firing of the blue load linear stapler. The staple line was inspected and was intact without bleeding. The common enterotomy was then closed with running 2-0 Vicryl begun at either end and tied centrally. The wall tube was then easily passed through the  anastomosis and an outer anterior layer of running 2-0 Vicryl was placed. The Ewald tube was removed. With the outlet of the gastrojejunostomy clamped and under saline irrigation the assistant performed upper endoscopy and with the gastric pouch tensely distended there was a small intermitant stream of bubbles from the anterior lateral anastamosis. It otherwise looked fine and I suspect this was from a suture needle hole.  I placed a 3-0 Vicryl figure of 8 suture here with an SH needle.  With further tense insufflation there was no further bubbling. The pouch was desufflated. The Vonita Mosseterson defect was closed with running 2-0 silk. The abdomen was inspected for any evidence of bleeding or bowel injury and everything looked fine. The Nathanson retractor was removed under direct vision after coating the anastomosis with Tisseel tissue sealant. All CO2 was evacuated and trochars removed. Skin incisions were closed with staples. Sponge needle and instrument counts were correct. The patient was taken to the PACU in good condition.     Maria SaaBenjamin T Jadon Harbaugh MD, FACS  06/05/2014, 10:50 AM

## 2014-06-05 NOTE — Op Note (Signed)
Doristine Sectiondrienne R Hejl [id number] [birth year] 06/05/2014  Preoperative diagnosis: morbid obesity  Postoperative diagnosis: Same   Procedure: Upper endoscopy   Surgeon: Atilano InaEric M Yoshie Kosel M.D., FACS   Anesthesia: Gen.   Indications for procedure: 23 yo WF undergoing a laparoscopic roux en y gastric bypass and an upper endoscopy was requested to evaluate the anastomosis.  Description of procedure: After we have completed the new gastrojejunostomy, I scrubbed out and obtained the penatx endoscope. I gently placed endoscope in the patient's oropharynx and gently glided it down the esophagus without any difficulty under direct visualization. Once I was in the gastric pouch, I insufflated the pouch was air. The pouch was approximately 4 cm(GE junction 43, anastomosis at 47cm) in size. I was able to cannulate and advanced the scope through the gastrojejunostomy. Dr.Hoxworth had placed saline in the upper abdomen. Upon further insufflation of the gastric pouch there were a few of bubbles of air at pt's left end of the G-J anastomosis. Upon further inspection of the gastric pouch, the mucosa appeared normal. There is no evidence of any mucosal abnormality. Dr Johna SheriffHoxworth reinforced the anastomosis with an additional suture - see his op note. The pouch and anastomosis were re-insuffulated and there was no evidence of bubbles. The gastric pouch and Roux limb were decompressed. The width of the gastrojejunal anastomosis was at least 2.5 cm. The scope was withdrawn. The patient tolerated this portion of the procedure well. Please see Dr Jamse MeadHoxworth's operative note for details regarding the laparoscopic roux-en-y gastric bypass.  Mary SellaEric M. Andrey CampanileWilson, MD, FACS General, Bariatric, & Minimally Invasive Surgery [facility] [place]

## 2014-06-05 NOTE — Anesthesia Preprocedure Evaluation (Addendum)
Anesthesia Evaluation  Patient identified by MRN, date of birth, ID band Patient awake    Reviewed: Allergy & Precautions, H&P , NPO status , Patient's Chart, lab work & pertinent test results  Airway Mallampati: II  TM Distance: >3 FB Neck ROM: Full    Dental no notable dental hx.    Pulmonary neg pulmonary ROS,  breath sounds clear to auscultation  Pulmonary exam normal       Cardiovascular negative cardio ROS  Rhythm:Regular Rate:Normal     Neuro/Psych negative neurological ROS  negative psych ROS   GI/Hepatic negative GI ROS, Neg liver ROS,   Endo/Other  Morbid obesity  Renal/GU negative Renal ROS  negative genitourinary   Musculoskeletal negative musculoskeletal ROS (+)   Abdominal   Peds negative pediatric ROS (+)  Hematology negative hematology ROS (+)   Anesthesia Other Findings   Reproductive/Obstetrics negative OB ROS                             Anesthesia Physical Anesthesia Plan  ASA: III  Anesthesia Plan: General   Post-op Pain Management:    Induction: Intravenous  Airway Management Planned: Oral ETT  Additional Equipment:   Intra-op Plan:   Post-operative Plan: Extubation in OR  Informed Consent: I have reviewed the patients History and Physical, chart, labs and discussed the procedure including the risks, benefits and alternatives for the proposed anesthesia with the patient or authorized representative who has indicated his/her understanding and acceptance.   Dental advisory given  Plan Discussed with: CRNA  Anesthesia Plan Comments:         Anesthesia Quick Evaluation  

## 2014-06-05 NOTE — H&P (View-Only) (Signed)
Chief complaint: Preop gastric bypass  History: Patient returns for a preop visit prior to planned laparoscopic Roux-en-Y gastric bypass for morbid obesity. She has successfully completed her preoperative workup. There were no concerns Raised on psychologic or nutritional evaluation. We reviewed her ultrasound and lab work and x-rays which were unremarkable except for some mild elevated LFTs which are chronic. She has started her preoperative diet.  Past Medical History  Diagnosis Date  . Migraines   . Low back pain   . Pain in joint   . Vitamin D deficiency    Past Surgical History  Procedure Laterality Date  . Breath tek h pylori N/A 01/05/2014    Procedure: BREATH TEK H PYLORI;  Surgeon: Khaila Velarde T Tashay Bozich, MD;  Location: [facility];  Service: General;  Laterality: N/A;   Current Outpatient Prescriptions  Medication Sig Dispense Refill  . acetaminophen (TYLENOL) 500 MG tablet Take 1,000 mg by mouth every 6 (six) hours as needed for mild pain or moderate pain.      . ranitidine (ZANTAC) 150 MG tablet Take 150 mg by mouth 2 (two) times daily.      . rizatriptan (MAXALT) 10 MG tablet Take 10 mg by mouth as needed for migraine. May repeat in 2 hours if needed       No current facility-administered medications for this visit.   No Known Allergies Exam: BP 124/74  Pulse 75  Temp(Src) 97.5 F (36.4 C)  Ht 5' 8" (1.727 m)  Wt 317 lb (143.79 kg)  BMI 48.21 kg/m2 Gen.: Morbidly obese but otherwise well-appearing Skin: No rash or infection HEENT: No palpable masses or scleral icterus Lungs: Clear equal breath sounds without increased work of breathing Cardiac: Regular rate and rhythm no edema Abdomen: Soft and nontender.  Assessment and plan: Agree to proceed with planned laparoscopic Roux-en-Y gastric bypass. She was given a prescription for bowel prep and pain medication. We reviewed the consent form and all her questions were answered. 

## 2014-06-05 NOTE — Anesthesia Postprocedure Evaluation (Signed)
  Anesthesia Post-op Note  Patient: Maria Mccoy  Procedure(s) Performed: Procedure(s) (LRB): LAPAROSCOPIC ROUX-EN-Y GASTRIC (N/A)  Patient Location: PACU  Anesthesia Type: General  Level of Consciousness: awake and alert   Airway and Oxygen Therapy: Patient Spontanous Breathing  Post-op Pain: mild  Post-op Assessment: Post-op Vital signs reviewed, Patient's Cardiovascular Status Stable, Respiratory Function Stable, Patent Airway and No signs of Nausea or vomiting  Last Vitals:  Filed Vitals:   06/05/14 1330  BP: 142/77  Pulse: 82  Temp: 36.7 C  Resp: 18    Post-op Vital Signs: stable   Complications: No apparent anesthesia complications

## 2014-06-05 NOTE — Interval H&P Note (Signed)
History and Physical Interval Note:  06/05/2014 7:02 AM  Maria Mccoy  has presented today for surgery, with the diagnosis of Morbid Obesity  The various methods of treatment have been discussed with the patient and family. After consideration of risks, benefits and other options for treatment, the patient has consented to  Procedure(s): LAPAROSCOPIC ROUX-EN-Y GASTRIC (N/A) as a surgical intervention .  The patient's history has been reviewed, patient examined, no change in status, stable for surgery.  I have reviewed the patient's chart and labs.  Questions were answered to the patient's satisfaction.     Hailei Besser T

## 2014-06-06 ENCOUNTER — Encounter (HOSPITAL_COMMUNITY): Payer: Self-pay | Admitting: General Surgery

## 2014-06-06 ENCOUNTER — Inpatient Hospital Stay (HOSPITAL_COMMUNITY): Payer: Managed Care, Other (non HMO)

## 2014-06-06 LAB — CBC WITH DIFFERENTIAL/PLATELET
BASOS PCT: 0 % (ref 0–1)
Basophils Absolute: 0 10*3/uL (ref 0.0–0.1)
Eosinophils Absolute: 0 10*3/uL (ref 0.0–0.7)
Eosinophils Relative: 0 % (ref 0–5)
HCT: 39.8 % (ref 36.0–46.0)
Hemoglobin: 13.5 g/dL (ref 12.0–15.0)
Lymphocytes Relative: 12 % (ref 12–46)
Lymphs Abs: 1.3 10*3/uL (ref 0.7–4.0)
MCH: 32.1 pg (ref 26.0–34.0)
MCHC: 33.9 g/dL (ref 30.0–36.0)
MCV: 94.5 fL (ref 78.0–100.0)
Monocytes Absolute: 0.7 10*3/uL (ref 0.1–1.0)
Monocytes Relative: 6 % (ref 3–12)
NEUTROS PCT: 82 % — AB (ref 43–77)
Neutro Abs: 8.9 10*3/uL — ABNORMAL HIGH (ref 1.7–7.7)
PLATELETS: 209 10*3/uL (ref 150–400)
RBC: 4.21 MIL/uL (ref 3.87–5.11)
RDW: 12.5 % (ref 11.5–15.5)
WBC: 10.9 10*3/uL — ABNORMAL HIGH (ref 4.0–10.5)

## 2014-06-06 LAB — HEMOGLOBIN AND HEMATOCRIT, BLOOD
HEMATOCRIT: 39.8 % (ref 36.0–46.0)
Hemoglobin: 13.7 g/dL (ref 12.0–15.0)

## 2014-06-06 MED ORDER — IOHEXOL 300 MG/ML  SOLN
50.0000 mL | Freq: Once | INTRAMUSCULAR | Status: AC | PRN
  Administered 2014-06-06: 20 mL via ORAL

## 2014-06-06 MED ORDER — BISACODYL 10 MG RE SUPP: 10.0000 mg | Freq: Once | RECTAL | Status: DC

## 2014-06-06 NOTE — Progress Notes (Signed)
Patient ID: Maria Mccoy, female   DOB: April 16, 1991, 23 y.o.   MRN: [id number] 1 Day Post-Op  Subjective: Still with some pain under ribs radiating to neck and shoulders, better with meds and no worse today.  Some nausea controlled with meds. Ambulatory  Objective: Vital signs in last 24 hours: Temp:  [97.6 F (36.4 C)-98.7 F (37.1 C)] 98.1 F (36.7 C) (08/11 0511) Pulse Rate:  [56-88] 58 (08/11 0511) Resp:  [16-22] 16 (08/11 0511) BP: (114-148)/(60-85) 132/85 mmHg (08/11 0511) SpO2:  [94 %-100 %] 100 % (08/11 0511) Last BM Date: 06/05/14  Intake/Output from previous day: 08/10 0701 - 08/11 0700 In: 4500 [I.V.:4300; IV Piggyback:200] Out: 2670 [Urine:2670] Intake/Output this shift:    General appearance: alert, cooperative and no distress Resp: No wheezing or increased WOB GI: normal findings: soft, non-tender Incision/Wound: Clean and dry  Lab Results:   Recent Labs  06/05/14 1923 06/06/14 0510  WBC  --  10.9*  HGB 14.4 13.5  HCT 42.0 39.8  PLT  --  209   BMET  Recent Labs  06/05/14 0540  NA 143  K 4.0  CL 105  CO2 23  GLUCOSE 99  BUN 7  CREATININE 0.91  CALCIUM 9.6     Studies/Results: Gastrograffin swallow reviewed, no leak or obstruction  Anti-infectives: Anti-infectives   Start     Dose/Rate Route Frequency Ordered Stop   06/05/14 0514  cefOXitin (MEFOXIN) 2 g in dextrose 5 % 50 mL IVPB     2 g 100 mL/hr over 30 Minutes Intravenous On call to O.R. 06/05/14 [id number] 06/05/14 0716      Assessment/Plan: s/p Procedure(s): LAPAROSCOPIC ROUX-EN-Y GASTRIC Stable post op.  Suspect pain related to CO2, abdomen benign on exam Start POD 1 diet    LOS: 1 day    Taler Kushner T 06/06/2014

## 2014-06-06 NOTE — Progress Notes (Signed)
Nutrition Education Note  Received consult for diet education per DROP protocol.   Discussed 2 week post op diet with pt. Emphasized that liquids must be non carbonated, non caffeinated, and sugar free. Fluid goals discussed. Pt to follow up with outpatient bariatric RD for further diet progression after 2 weeks. Multivitamins and minerals also reviewed. Teach back method used, pt expressed understanding, expect good compliance. Pt c/o some mild gas pain. Had questions about gummy multivitamins which were answered. Had trouble PTA staying hydrated, discussed sugar free flavorings to add to water to promote adequate hydration.    Diet: First 2 Weeks  You will see the nutritionist about two (2) weeks after your surgery. The nutritionist will increase the types of foods you can eat if you are handling liquids well:  If you have severe vomiting or nausea and cannot handle clear liquids lasting longer than 1 day, call your surgeon  Protein Shake  Drink at least 2 ounces of shake 5-6 times per day  Each serving of protein shakes (usually 8 - 12 ounces) should have a minimum of:  15 grams of protein  And no more than 5 grams of carbohydrate  Goal for protein each day:  Men = 80 grams per day  Women = 60 grams per day  Protein powder may be added to fluids such as non-fat milk or Lactaid milk or Soy milk (limit to 35 grams added protein powder per serving)   Hydration  Slowly increase the amount of water and other clear liquids as tolerated (See Acceptable Fluids)  Slowly increase the amount of protein shake as tolerated  Sip fluids slowly and throughout the day  May use sugar substitutes in small amounts (no more than 6 - 8 packets per day; i.e. Splenda)   Fluid Goal  The first goal is to drink at least 8 ounces of protein shake/drink per day (or as directed by the nutritionist); some examples of protein shakes are ITT IndustriesSyntrax Nectar, Dillard'sdkins Advantage, EAS Edge HP, and Unjury. See handout from pre-op  Bariatric Education Class:  Slowly increase the amount of protein shake you drink as tolerated  You may find it easier to slowly sip shakes throughout the day  It is important to get your proteins in first  Your fluid goal is to drink 64 - 100 ounces of fluid daily  It may take a few weeks to build up to this  32 oz (or more) should be clear liquids  And  32 oz (or more) should be full liquids (see below for examples)  Liquids should not contain sugar, caffeine, or carbonation   Clear Liquids:  Water or Sugar-free flavored water (i.e. Fruit H2O, Propel)  Decaffeinated coffee or tea (sugar-free)  Crystal Lite, Wyler's Lite, Minute Maid Lite  Sugar-free Jell-O  Bouillon or broth  Sugar-free Popsicle: *Less than 20 calories each; Limit 1 per day   Full Liquids:  Protein Shakes/Drinks + 2 choices per day of other full liquids  Full liquids must be:  No More Than 12 grams of Carbs per serving  No More Than 3 grams of Fat per serving  Strained low-fat cream soup  Non-Fat milk  Fat-free Lactaid Milk  Sugar-free yogurt (Dannon Lite & Fit, Greek yogurt)     CirclevilleHeather Tyde Lamison MS, RD, [place] [phone] Pager [phone] Weekend/After Hours Pager

## 2014-06-07 ENCOUNTER — Telehealth (INDEPENDENT_AMBULATORY_CARE_PROVIDER_SITE_OTHER): Payer: Self-pay | Admitting: General Surgery

## 2014-06-07 LAB — CBC WITH DIFFERENTIAL/PLATELET
BASOS ABS: 0 10*3/uL (ref 0.0–0.1)
BASOS PCT: 0 % (ref 0–1)
EOS ABS: 0 10*3/uL (ref 0.0–0.7)
Eosinophils Relative: 1 % (ref 0–5)
HEMATOCRIT: 36.2 % (ref 36.0–46.0)
Hemoglobin: 12.4 g/dL (ref 12.0–15.0)
Lymphocytes Relative: 38 % (ref 12–46)
Lymphs Abs: 3 10*3/uL (ref 0.7–4.0)
MCH: 32.3 pg (ref 26.0–34.0)
MCHC: 34.3 g/dL (ref 30.0–36.0)
MCV: 94.3 fL (ref 78.0–100.0)
MONO ABS: 0.4 10*3/uL (ref 0.1–1.0)
Monocytes Relative: 6 % (ref 3–12)
NEUTROS ABS: 4.3 10*3/uL (ref 1.7–7.7)
Neutrophils Relative %: 55 % (ref 43–77)
Platelets: 150 10*3/uL (ref 150–400)
RBC: 3.84 MIL/uL — ABNORMAL LOW (ref 3.87–5.11)
RDW: 12.9 % (ref 11.5–15.5)
WBC: 7.8 10*3/uL (ref 4.0–10.5)

## 2014-06-07 NOTE — Progress Notes (Signed)
Patient is alert and oriented, vital signs are stable, incisions are within normal limits, patient tolerated shakes, discharge instructions with patient and spouse, patient to follow up with MD Stanford BreedBracey, Andersen Mckiver N RN 06-07-2014 14:00pm

## 2014-06-07 NOTE — Telephone Encounter (Signed)
Pt called stating her forearm was red and warm to touch.  This was the same arm as the IV that was removed today.  Recommended warm compresses.  If it gets worse or fails to improve she should call back.

## 2014-06-07 NOTE — Discharge Summary (Signed)
   Patient ID: Maria Mccoy [id number] 23 y.o. [birth year]  06/05/2014  Discharge date and time: 06/07/2014   Admitting Physician: Glenna FellowsHOXWORTH,Lakely Elmendorf T  Discharge Physician: Glenna FellowsHOXWORTH,Gianni Mihalik T  Admission Diagnoses: Morbid Obesity  Discharge Diagnoses: Same  Operations: Procedure(s): LAPAROSCOPIC ROUX-EN-Y GASTRIC  Admission Condition: good  Discharged Condition: good  Indication for Admission: Patient is a 23 year old female with morbid obesity unresponsive to multiple attempts at medical management. After extensive preoperative workup and discussion detailed elsewhere she is electively admitted for laparoscopic Roux-en-Y gastric bypass for treatment of her morbid obesity  Hospital Course: On the morning of admission the patient underwent an uneventful laparoscopic Roux-en-Y gastric bypass. Her postoperative course was uncomplicated. She did have some initially sharp left upper quadrant pain that gradually improved during her hospitalization. Gastrografin swallow on the first postoperative day was negative for leak or obstruction. She tolerated water well. On the second postoperative day she is afebrile with normal white count and heart rate. Abdomen is benign her pain is improving. She is advanced to protein shakes. Will be discharged if these are tolerated well.    Disposition: Home  Patient Instructions:    Medication List         acetaminophen 500 MG tablet  Commonly known as:  TYLENOL  Take 1,000 mg by mouth every 6 (six) hours as needed for mild pain or moderate pain.     ondansetron 4 MG tablet  Commonly known as:  ZOFRAN  Take 1 tablet (4 mg total) by mouth every 8 (eight) hours as needed for nausea.     oxyCODONE 5 MG/5ML solution  Commonly known as:  ROXICODONE  Take 5-10 mLs (5-10 mg total) by mouth every 4 (four) hours as needed.     ranitidine 150 MG tablet  Commonly known as:  ZANTAC  Take 150 mg by mouth 2 (two) times daily.     rizatriptan 10 MG  tablet  Commonly known as:  MAXALT  Take 10 mg by mouth as needed for migraine. May repeat in 2 hours if needed        Activity: activity as tolerated Diet: bariatric protein shake Wound Care: none needed  Follow-up:  With Dr. Johna SheriffHoxworth in 3 weeks.  Signed: Mariella SaaBenjamin T Audie Stayer MD, FACS  06/07/2014, 8:07 AM

## 2014-06-07 NOTE — Progress Notes (Signed)
Patient ID: Maria Mccoy, female   DOB: [birth year], 23 y.o.   MRN: [id number] 2 Days Post-Op  Subjective: Still with some left upper quadrant pain but it is better than yesterday, hurts only when moving and relieved with meds. Tolerating water without difficulty. No flatus or bowel movement yet  Objective: Vital signs in last 24 hours: Temp:  [97.6 F (36.4 C)-98.9 F (37.2 C)] 98.9 F (37.2 C) (08/12 0511) Pulse Rate:  [49-73] 73 (08/12 0511) Resp:  [16-18] 18 (08/12 0511) BP: (105-136)/(63-88) 105/63 mmHg (08/12 0511) SpO2:  [96 %-100 %] 96 % (08/12 0511) Last BM Date: 06/05/14  Intake/Output from previous day: 08/11 0701 - 08/12 0700 In: 3057.9 [P.O.:60; I.V.:2997.9] Out: 1500 [Urine:1500] Intake/Output this shift:    General appearance: alert, cooperative and no distress Resp: no wheezing or increased work of breathing GI: minimal left upper quadrant tenderness without guarding bowel sounds active Incision/Wound: clean and dry  Lab Results:   Recent Labs  06/06/14 0510 06/06/14 1621 06/07/14 0505  WBC 10.9*  --  7.8  HGB 13.5 13.7 12.4  HCT 39.8 39.8 36.2  PLT 209  --  150   BMET  Recent Labs  06/05/14 0540  NA 143  K 4.0  CL 105  CO2 23  GLUCOSE 99  BUN 7  CREATININE 0.91  CALCIUM 9.6     Studies/Results: Dg Ugi W/water Sol Cm  06/06/2014   CLINICAL DATA:  Post gastric bypass surgery  EXAM: WATER SOLUBLE UPPER GI SERIES  TECHNIQUE: Single-column upper GI series was performed using water soluble contrast.  CONTRAST:  20mL OMNIPAQUE IOHEXOL 300 MG/ML  SOLN  COMPARISON:  None.  FLUOROSCOPY TIME:  0 min 38 seconds  FINDINGS: Water-soluble contrast flowed readily through the gas esophageal junction into the small gastric pouch. Contrast readily flowed through the gastrojejunostomy into the proximal small bowel. There is no evidence of leak or obstruction.  IMPRESSION: No evidence of leak or obstruction following gastric bypass surgery.   Electronically  Signed   By: Genevive BiStewart  Edmunds M.D.   On: 06/06/2014 10:37    Anti-infectives: Anti-infectives   Start     Dose/Rate Route Frequency Ordered Stop   06/05/14 0514  cefOXitin (MEFOXIN) 2 g in dextrose 5 % 50 mL IVPB     2 g 100 mL/hr over 30 Minutes Intravenous On call to O.R. 06/05/14 [id number] 06/05/14 0716      Assessment/Plan: s/p Procedure(s): LAPAROSCOPIC ROUX-EN-Y GASTRIC Bypass Doing well. Still some discomfort but this is steadily improving with normal labs and vital signs in benign abdominal exam. Advanced protein shakes today. Plan discharge later today if tolerated well.    LOS: 2 days    Skyy Nilan T 06/07/2014

## 2014-06-07 NOTE — Discharge Instructions (Signed)
per bariatric nurse coordinator     GASTRIC BYPASS/SLEEVE  Home Care Instructions   These instructions are to help you care for yourself when you go home.  Call: If you have any problems.   Call [phone] and ask for the surgeon on call   If you need immediate assistance come to the ER at [facility]. Tell the ER staff you are a new post-op gastric bypass or gastric sleeve patient  Signs and symptoms to report:   Severe  vomiting or nausea o If you cannot handle clear liquids for longer than 1 day, call your surgeon   Abdominal pain which does not get better after taking your pain medication   Fever greater than 100.4  F and chills   Heart rate over 100 beats a minute   Trouble breathing   Chest pain   Redness,  swelling, drainage, or foul odor at incision (surgical) sites   If your incisions open or pull apart   Swelling or pain in calf (lower leg)   Diarrhea (Loose bowel movements that happen often), frequent watery, uncontrolled bowel movements   Constipation, (no bowel movements for 3 days) if this happens: o Take Milk of Magnesia, 2 tablespoons by mouth, 3 times a day for 2 days if needed o Stop taking Milk of Magnesia once you have had a bowel movement o Call your doctor if constipation continues Or o Take Miralax  (instead of Milk of Magnesia) following the label instructions o Stop taking Miralax once you have had a bowel movement o Call your doctor if constipation continues   Anything you think is abnormal for you   Normal side effects after surgery:   Unable to sleep at night or unable to concentrate   Irritability   Being tearful (crying) or depressed  These are common complaints, possibly related to your anesthesia, stress of surgery, and change in lifestyle, that usually go away a few weeks after surgery. If these feelings continue, call your medical doctor.  Wound Care: You may have surgical glue, steri-strips, or staples over your incisions after surgery    Surgical glue: Looks like clear film over your incisions and will wear off a little at a time   Steri-strips: Adhesive strips of tape over your incisions. You may notice a yellowish color on skin under the steri-strips. This is used to make the steri-strips stick better. Do not pull the steri-strips off - let them fall off   Staples: Staples may be removed before you leave the hospital o If you go home with staples, call [facility] for an appointment with your surgeons nurse to have staples removed 10 days after surgery, [phone]   Showering: You may shower two (2) days after your surgery unless your surgeon tells you differently o Wash gently around incisions with warm soapy water, rinse well, and gently pat dry o If you have a drain (tube from your incision), you may need someone to hold this while you shower o No tub baths until staples are removed and incisions are healed   Medications:   Medications should be liquid or crushed if larger than the size of a dime   Extended release pills (medication that releases a little bit at a time through the  day) should not be crushed   Depending on the size and number of medications you take, you may need to space (take a few throughout the day)/change the time you take your medications so that you do  not over-fill your pouch (smaller stomach)   Make sure you follow-up with you primary care physician to make medication changes needed during rapid weight loss and life -style changes   If you have diabetes, follow up with your doctor that orders your diabetes medication(s) within one week after surgery and check your blood sugar regularly    Do not drive while taking narcotics (pain medications)    Do not take acetaminophen (Tylenol) and Roxicet or Lortab Elixir at the same time since these pain medications contain acetaminophen   Diet:  First 2 Weeks You will see the nutritionist about two (2) weeks after your surgery. The  nutritionist will increase the types of foods you can eat if you are handling liquids well:   If you have severe vomiting or nausea and cannot handle clear liquids lasting longer than 1 day call your surgeon Protein Shake   Drink at least 2 ounces of shake 5-6 times per day   Each serving of protein shakes (usually 8-12 ounces) should have a minimum of: o 15 grams of protein o And no more than 5 grams of carbohydrate   Goal for protein each day: o Men = 80 grams per day o Women = 60 grams per day      Protein powder may be added to fluids such as non-fat milk or Lactaid milk or Soy milk (limit to 35 grams added protein powder per serving)  Hydration   Slowly increase the amount of water and other clear liquids as tolerated (See Acceptable Fluids)   Slowly increase the amount of protein shake as tolerated   Sip fluids slowly and throughout the day   May use sugar substitutes in small amounts (no more than 6-8 packets per day; i.e. Splenda)  Fluid Goal   The first goal is to drink at least 8 ounces of protein shake/drink per day (or as directed by the nutritionist); some examples of protein shakes are I[employer]Syntrax Nectar, Dillard'sdkins Advantage, EAS Edge HP, and Unjury. - See handout from pre-op Bariatric Education Class: o Slowly increase the amount of protein shake you drink as tolerated o You may find it easier to slowly sip shakes throughout the day o It is important to get your proteins in first   Your fluid goal is to drink 64-100 ounces of fluid daily o It may take a few weeks to build up to this    32 oz. (or more) should be clear liquids And   32 oz. (or more) should be full liquids (see below for examples)   Liquids should not contain sugar, caffeine, or carbonation  Clear Liquids:   Water of Sugar-free flavored water (i.e. Fruit HO, Propel)   Decaffeinated coffee or tea (sugar-free)   Crystal lite, Wylers Lite, Minute Maid Lite   Sugar-free Jell-O   Bouillon or broth   Sugar-free  Popsicle:    - Less than 20 calories each; Limit 1 per day  Full Liquids:                   Protein Shakes/Drinks + 2 choices per day of other full liquids   Full liquids must be: o No More Than 12 grams of Carbs per serving o No More Than 3 grams of Fat per serving   Strained low-fat cream soup   Non-Fat milk   Fat-free Lactaid Milk   Sugar-free yogurt (Dannon Lite & Fit, Greek yogurt)    Vitamins and Minerals   Start 1 day after  surgery unless otherwise directed by your surgeon   2 Chewable Multivitamin / Multimineral Supplement with iron (i.e. Centrum for Adults)   Vitamin B-12, 350-500 micrograms sub-lingual (place tablet under the tongue) each day   Chewable Calcium Citrate with Vitamin D-3 (Example: 3 Chewable Calcium  Plus 600 with Vitamin D-3) o Take 500 mg three (3) times a day for a total of 1500 mg each day o Do not take all 3 doses of calcium at one time as it may cause constipation, and you can only absorb 500 mg at a time o Do not mix multivitamins containing iron with calcium supplements;  take 2 hours apart o Do not substitute Tums (calcium carbonate) for your calcium   Menstruating women and those at risk for anemia ( a blood disease that causes weakness) may need extra iron o Talk to your doctor to see if you need more iron   If you need extra iron: Total daily Iron recommendation (including Vitamins) is 50 to 100 mg Iron/day   Do not stop taking or change any vitamins or minerals until you talk to your nutritionist or surgeon   Your nutritionist and/or surgeon must approve all vitamin and mineral supplements   Activity and Exercise: It is important to continue walking at home. Limit your physical activity as instructed by your doctor. During this time, use these guidelines:   Do not lift anything greater than ten  (10) pounds for at least two (2) weeks   Do not go back to work or drive until Designer, industrial/product says you can   You may have sex when you feel comfortable o It  is VERY important for female patients to use a reliable birth control method; fertility often increase after surgery o Do not get pregnant for at least 18 months   Start exercising as soon as your doctor tells you that you can o Make sure your doctor approves any physical activity   Start with a simple walking program   Walk 5-15 minutes each day, 7 days per week   Slowly increase until you are walking 30-45 minutes per day   Consider joining our BELT program. [phone] or email [email or web address]   Special Instructions Things to remember:   Free counseling is available for you and your family through collaboration between [facility] and [facility]. Please call [phone] and leave a message   Use your CPAP when sleeping if this applies to you   Consider buying a medical alert bracelet that says you had lap-band surgery     You will likely have your first fill (fluid added to your band) 6 - 8 weeks after surgery   [facility] has a free Bariatric Surgery Support Group that meets monthly, the 3rd Thursday, 6pm. Calvert Cantor. You can see classes online at [email or web address]   It is very important to keep all follow up appointments with your surgeon, nutritionist, primary care physician, and behavioral health practitioner o After the first year, please follow up with your bariatric surgeon and nutritionist at least once a year in order to maintain best weight loss results                    [facility]:  [phone]               [facility]: [phone]               Bariatric Nurse Coordinator:  by [place] Patient Education Committee, Jan, 2014 ° ° ° ° ° ° ° ° ° °

## 2014-06-07 NOTE — Progress Notes (Signed)
Patient alert and oriented, pain is controlled. Patient is tolerating fluids, advanced to protein shake today. Reviewed Gastric Bypass discharge instructions with patient and patient is able to articulate understanding. Provided information on BELT program, Support Group and [facility] outpatient pharmacy. All questions answered, will continue to monitor.    

## 2014-06-08 NOTE — Telephone Encounter (Signed)
Tried calling pt to see how she was doing today, unable to leave message at this time.

## 2014-06-09 ENCOUNTER — Telehealth (HOSPITAL_COMMUNITY): Payer: Self-pay

## 2014-06-09 NOTE — Telephone Encounter (Signed)
Made discharge phone call to patient per DROP protocol. Asking the following questions.    1. Do you have someone to care for you now that you are home?  yes 2. Are you having pain now that is not relieved by your pain medication?  no 3. Are you able to drink the recommended daily amount of fluids (48 ounces minimum/day) and protein (60-80 grams/day) as prescribed by the dietitian or nutritional counselor?  Trying, working on it 4. Are you taking the vitamins and minerals as prescribed?  yes 5. Do you have the "on call" number to contact your surgeon if you have a problem or question?  yes 6. Are your incisions free of redness, swelling or drainage? (If steri strips, address that these can fall off, shower as tolerated) yes 7. Have your bowels moved since your surgery? yes  If not, are you passing gas?   8. Are you up and walking 3-4 times per day?  yes    1. Do you have an appointment made to see your surgeon in the next month?  yes 2. Were you provided your discharge medications before your surgery or before you were discharged from the hospital and are you taking them without problem?  yes 3. Were you provided phone numbers to the clinic/surgeon's office?  yes 4. Did you watch the patient education video module in the (clinic, surgeon's office, etc.) before your surgery? yes 5. Do you have a discharge checklist that was provided to you in the hospital to reference with instructions on how to take care of yourself after surgery?  yes 6. Did you see a dietitian or nutritional counselor while you were in the hospital?  yes 7. Do you have an appointment to see a dietitian or nutritional counselor in the next month? yes

## 2014-06-12 ENCOUNTER — Telehealth (INDEPENDENT_AMBULATORY_CARE_PROVIDER_SITE_OTHER): Payer: Self-pay

## 2014-06-12 NOTE — Telephone Encounter (Signed)
Pt calling to see if dr has called her in anything to ger pharmacy. Informed pt that dr has 24 hours to review request, however our urgent office dr has the message and we will contact her as soon as we receive a response. Informed pt that she also call her PCP as well to see if they can call her in something as well.

## 2014-06-12 NOTE — Telephone Encounter (Signed)
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 She feels she has a UTI and is asking if she can have something called in for it. Advised Dr Johna SheriffHoxworth is unavailable today so we will need to ask our urgent office doctor this afternoon. We will call her back then.

## 2014-06-13 ENCOUNTER — Other Ambulatory Visit (INDEPENDENT_AMBULATORY_CARE_PROVIDER_SITE_OTHER): Payer: Self-pay

## 2014-06-13 MED ORDER — FLUCONAZOLE 100 MG PO TABS: 200.0000 mg | ORAL_TABLET | Freq: Every day | ORAL | Status: AC

## 2014-06-13 NOTE — Telephone Encounter (Signed)
Informed pt that rx was

## 2014-06-13 NOTE — Telephone Encounter (Signed)
Will route to Dr Johna SheriffHoxworth this am.

## 2014-06-13 NOTE — Telephone Encounter (Signed)
Patient verbally upset about prescription for yeast infection not being authorized for her to pick up pharmacy . Advised patient she could also call her PCP , we could not guarantee our physician would authorize a rx  For yeast infection .

## 2014-06-13 NOTE — Telephone Encounter (Signed)
Informed pt that rx for Diflucan was called into pts pharmacy. Pt verbalized understanding

## 2014-06-20 ENCOUNTER — Encounter: Payer: Managed Care, Other (non HMO) | Attending: General Surgery

## 2014-06-20 DIAGNOSIS — Z6841 Body Mass Index (BMI) 40.0 and over, adult: Secondary | ICD-10-CM | POA: Insufficient documentation

## 2014-06-20 DIAGNOSIS — Z713 Dietary counseling and surveillance: Secondary | ICD-10-CM | POA: Diagnosis not present

## 2014-06-20 NOTE — Progress Notes (Signed)
Bariatric Class:  Appt start time: 1530 end time:  1630.  2 Week Post-Operative Nutrition Class  Patient was seen on 06/20/2014 for Post-Operative Nutrition education at the [facility].   Surgery date: 06/05/2014 Surgery type: RYGB Start weight at [facility]: 327.5 lbs on 01/05/2014 Weight today: 299.5 lbs  Weight change: 19.5 lbs Total weight loss: 28 lbs  TANITA  BODY COMP RESULTS  05/08/14 06/20/14   BMI (kg/m^2) 48.5 45.5   Fat Mass (lbs) 177.5 169.0   Fat Free Mass (lbs) 141.5 130.5   Total Body Water (lbs) 103.5 95.5    The following the learning objectives were met by the patient during this course:  Identifies Phase 3A (Soft, High Proteins) Dietary Goals and will begin from 2 weeks post-operatively to 2 months post-operatively  Identifies appropriate sources of fluids and proteins   States protein recommendations and appropriate sources post-operatively  Identifies the need for appropriate texture modifications, mastication, and bite sizes when consuming solids  Identifies appropriate multivitamin and calcium sources post-operatively  Describes the need for physical activity post-operatively and will follow MD recommendations  States when to call healthcare provider regarding medication questions or post-operative complications  Handouts given during class include:  Phase 3A: Soft, High Protein Diet Handout  Follow-Up Plan: Patient will follow-up at [facility] in 6 weeks for 2 month post-op nutrition visit for diet advancement per MD.

## 2014-06-20 NOTE — Patient Instructions (Signed)
Patient to follow Phase 3A-Soft, High Protein Diet and follow-up at [facility] in 6 weeks for 2 months post-op nutrition visit for diet advancement. 

## 2014-06-29 ENCOUNTER — Ambulatory Visit (INDEPENDENT_AMBULATORY_CARE_PROVIDER_SITE_OTHER): Payer: Managed Care, Other (non HMO) | Admitting: General Surgery

## 2014-08-03 ENCOUNTER — Ambulatory Visit: Payer: Managed Care, Other (non HMO) | Admitting: Dietician

## 2015-10-25 IMAGING — US US ABDOMEN COMPLETE
1 series · 14 of 25 positions shown · non-contrast
Comparison: 05/31/2013

CLINICAL DATA: Screening for bariatric surgery

EXAM:
ULTRASOUND ABDOMEN COMPLETE

[Series 1: us abdomen complete · 14 of 75 slices shown]
[im 1/75]
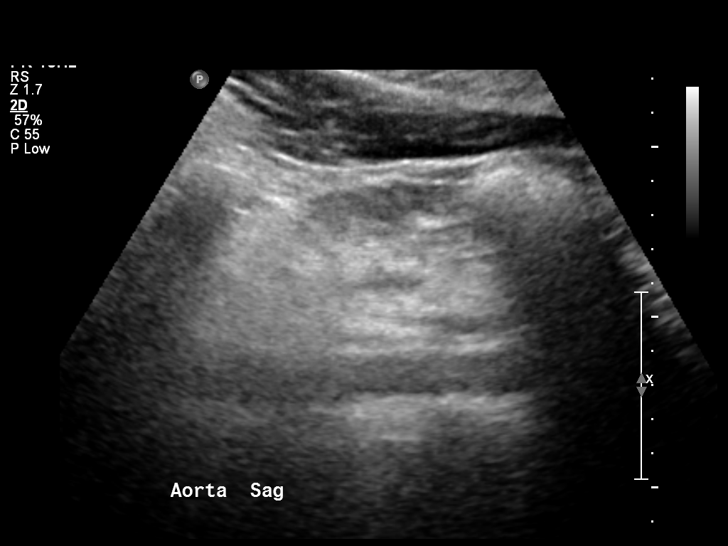
[im 7/75]
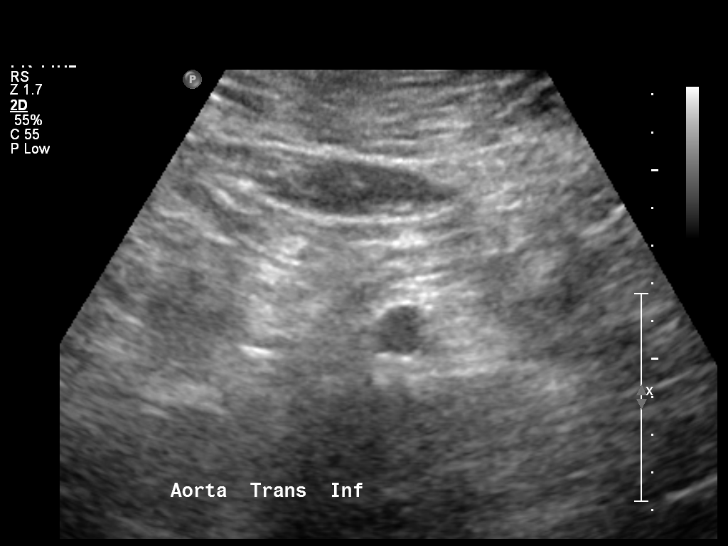
[im 13/75]
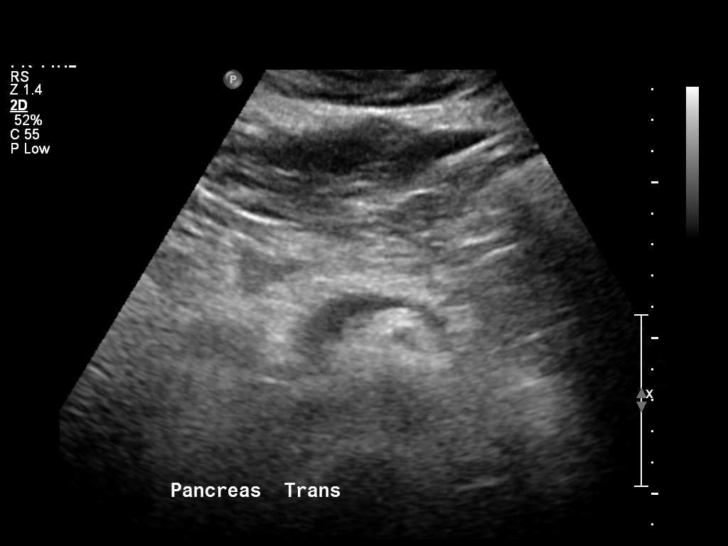
[im 19/75]
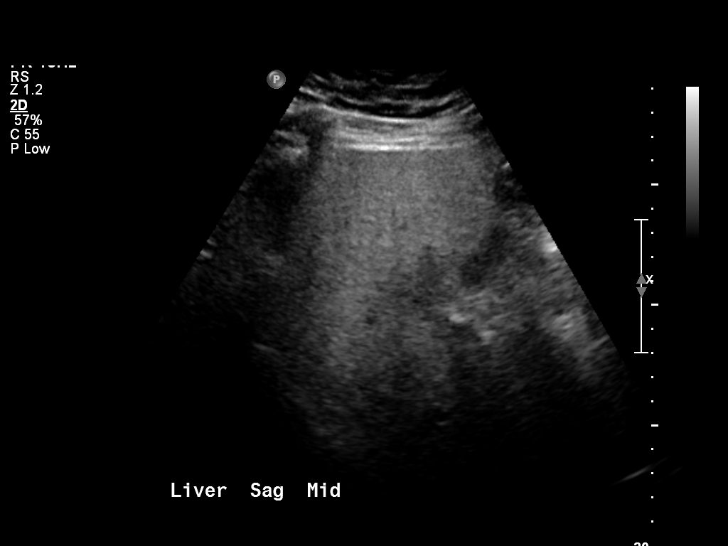
[im 25/75]
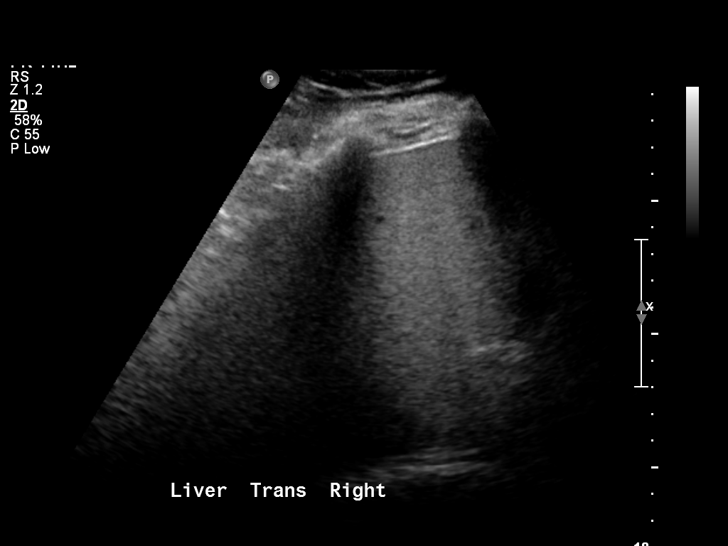
[im 28/75]
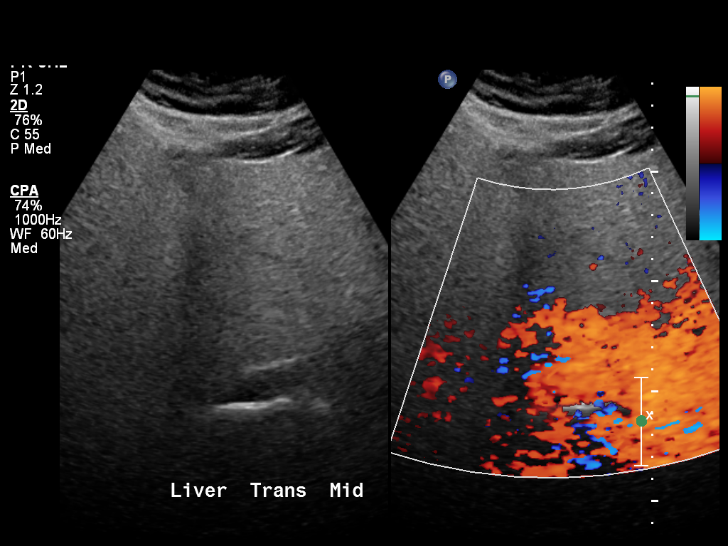
[im 34/75]
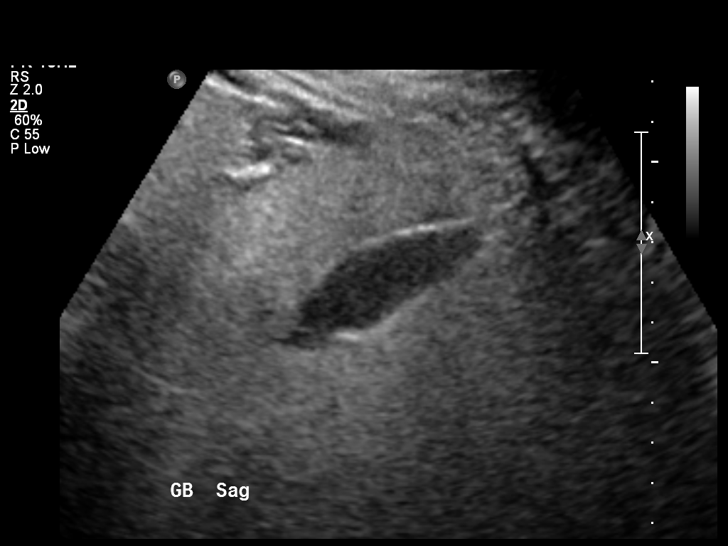
[im 41/75]
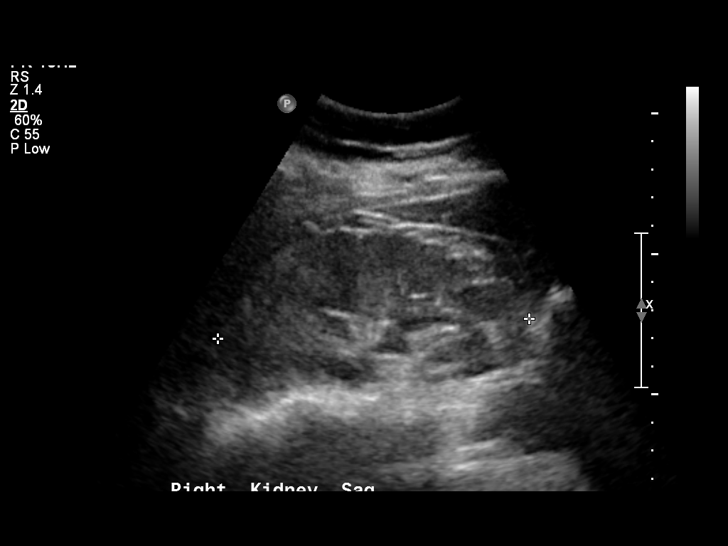
[im 47/75]
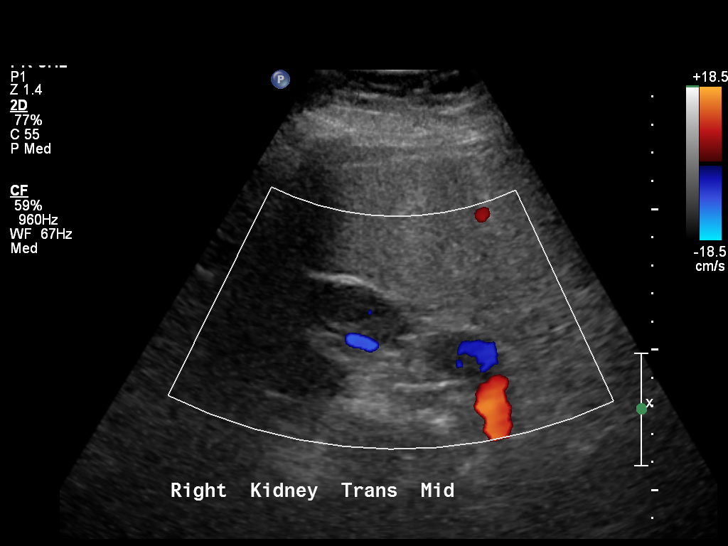
[im 50/75]
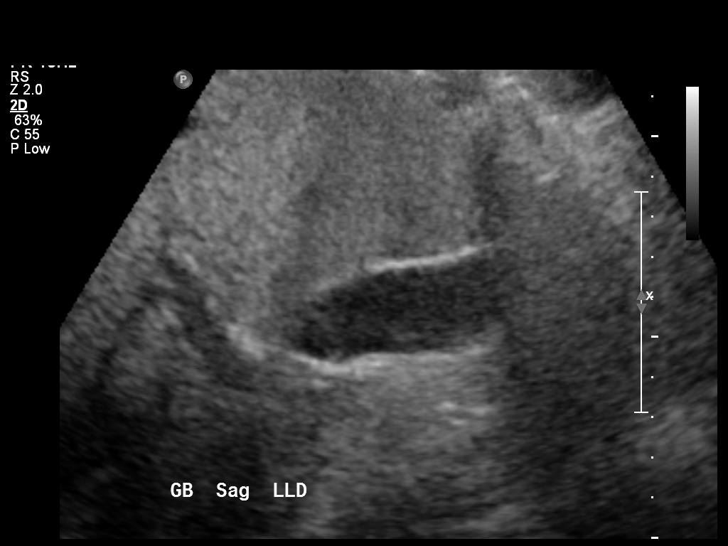
[im 56/75]
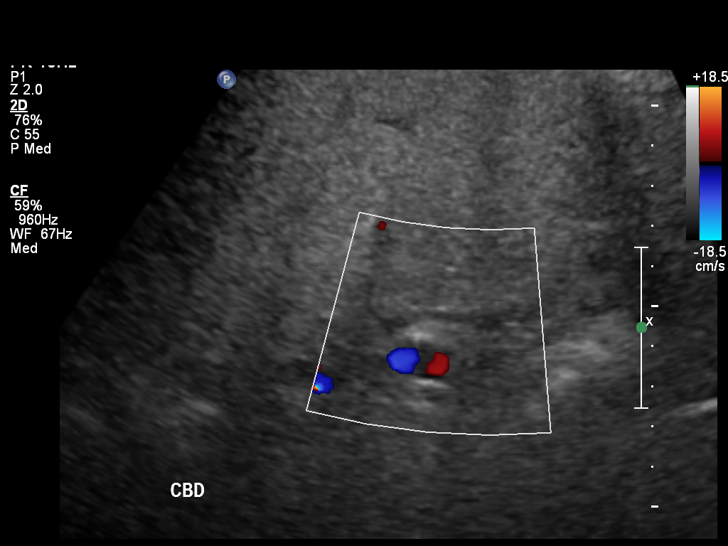
[im 62/75]
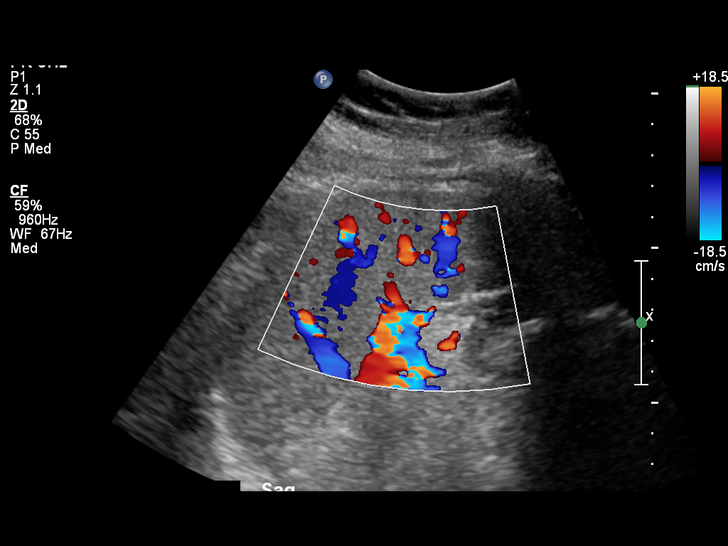
[im 68/75]
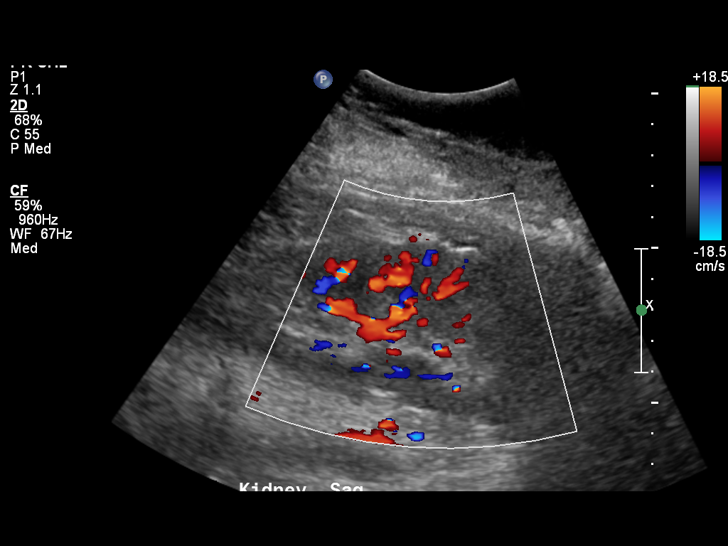
[im 75/75]
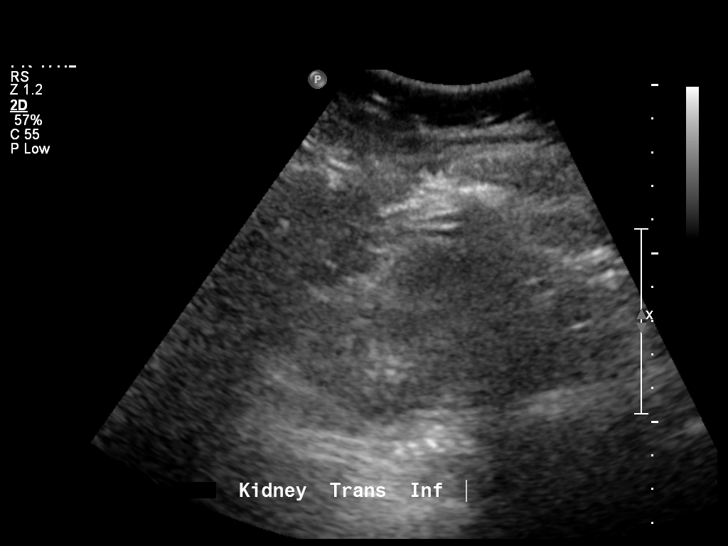

[14 of 25 positions shown; findings below may reference images not displayed]

FINDINGS: Gallbladder:

Normal gallbladder. No gallstones or gallbladder sludge. Negative
sonographic Murphy's sign.

Common bile duct:

Diameter: 3.7 mm

Liver:

No focal lesion identified. Mild increased parenchymal echogenicity.

IVC:

No abnormality visualized.

Pancreas:

Visualized portion unremarkable.

Spleen:

Size and appearance within normal limits.

Right Kidney:

Length: 11 cm. Echogenicity within normal limits. No mass or
hydronephrosis visualized.

Left Kidney:

Length: 10.2 cm.. Echogenicity within normal limits. No mass or
hydronephrosis visualized.

Abdominal aorta:

No aneurysm visualized.

Other findings:

None.
IMPRESSION: 1. No acute findings.
2. Hepatic steatosis.

## 2016-07-25 ENCOUNTER — Encounter (HOSPITAL_COMMUNITY): Payer: Self-pay

## 2018-01-07 ENCOUNTER — Encounter (HOSPITAL_COMMUNITY): Payer: Self-pay
